# Patient Record
Sex: Female | Born: 1985 | Race: Black or African American | Marital: Single | Smoking: Current every day smoker
Health system: Southern US, Community
[De-identification: ages and names within clinical notes are randomized; demographics above are authoritative.]

## PROBLEM LIST (undated history)

## (undated) DIAGNOSIS — N649 Disorder of breast, unspecified: Secondary | ICD-10-CM

## (undated) HISTORY — DX: Disorder of breast, unspecified: N64.9

---
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
## 2005-10-17 ENCOUNTER — Emergency Department (HOSPITAL_COMMUNITY): Admission: EM | Admit: 2005-10-17 | Discharge: 2005-10-17 | Payer: Self-pay | Admitting: Emergency Medicine

## 2006-02-18 ENCOUNTER — Emergency Department (HOSPITAL_COMMUNITY): Admission: EM | Admit: 2006-02-18 | Discharge: 2006-02-18 | Payer: Self-pay | Admitting: Emergency Medicine

## 2006-07-01 ENCOUNTER — Emergency Department (HOSPITAL_COMMUNITY): Admission: EM | Admit: 2006-07-01 | Discharge: 2006-07-01 | Payer: Self-pay | Admitting: Emergency Medicine

## 2006-10-03 ENCOUNTER — Emergency Department (HOSPITAL_COMMUNITY): Admission: EM | Admit: 2006-10-03 | Discharge: 2006-10-03 | Payer: Self-pay | Admitting: Emergency Medicine

## 2007-01-21 ENCOUNTER — Emergency Department (HOSPITAL_COMMUNITY): Admission: EM | Admit: 2007-01-21 | Discharge: 2007-01-21 | Payer: Self-pay | Admitting: Emergency Medicine

## 2007-03-08 ENCOUNTER — Emergency Department (HOSPITAL_COMMUNITY): Admission: EM | Admit: 2007-03-08 | Discharge: 2007-03-08 | Payer: Self-pay | Admitting: Emergency Medicine

## 2007-10-14 ENCOUNTER — Emergency Department (HOSPITAL_COMMUNITY): Admission: EM | Admit: 2007-10-14 | Discharge: 2007-10-14 | Payer: Self-pay | Admitting: Emergency Medicine

## 2008-08-11 ENCOUNTER — Emergency Department (HOSPITAL_COMMUNITY): Admission: EM | Admit: 2008-08-11 | Discharge: 2008-08-11 | Payer: Self-pay | Admitting: Emergency Medicine

## 2009-02-27 ENCOUNTER — Emergency Department (HOSPITAL_COMMUNITY): Admission: EM | Admit: 2009-02-27 | Discharge: 2009-02-27 | Payer: Self-pay | Admitting: Emergency Medicine

## 2009-04-27 ENCOUNTER — Emergency Department (HOSPITAL_COMMUNITY): Admission: EM | Admit: 2009-04-27 | Discharge: 2009-04-27 | Payer: Self-pay | Admitting: Emergency Medicine

## 2010-01-11 ENCOUNTER — Emergency Department (HOSPITAL_COMMUNITY): Admission: EM | Admit: 2010-01-11 | Discharge: 2010-01-11 | Payer: Self-pay | Admitting: Emergency Medicine

## 2010-02-05 ENCOUNTER — Emergency Department (HOSPITAL_COMMUNITY): Admission: EM | Admit: 2010-02-05 | Discharge: 2010-02-05 | Payer: Self-pay | Admitting: Emergency Medicine

## 2010-02-14 ENCOUNTER — Emergency Department (HOSPITAL_COMMUNITY): Admission: EM | Admit: 2010-02-14 | Discharge: 2010-02-14 | Payer: Self-pay | Admitting: Emergency Medicine

## 2010-02-23 ENCOUNTER — Emergency Department (HOSPITAL_COMMUNITY): Admission: EM | Admit: 2010-02-23 | Discharge: 2010-02-23 | Payer: Self-pay | Admitting: Emergency Medicine

## 2010-03-07 ENCOUNTER — Other Ambulatory Visit: Admission: RE | Admit: 2010-03-07 | Discharge: 2010-03-07 | Payer: Self-pay | Admitting: Obstetrics and Gynecology

## 2010-03-27 ENCOUNTER — Emergency Department (HOSPITAL_COMMUNITY): Admission: EM | Admit: 2010-03-27 | Discharge: 2010-03-28 | Payer: Self-pay | Admitting: Emergency Medicine

## 2010-10-17 ENCOUNTER — Inpatient Hospital Stay (HOSPITAL_COMMUNITY)
Admission: AD | Admit: 2010-10-17 | Discharge: 2010-10-21 | Payer: Self-pay | Source: Home / Self Care | Attending: Obstetrics & Gynecology | Admitting: Obstetrics & Gynecology

## 2010-10-17 ENCOUNTER — Inpatient Hospital Stay (HOSPITAL_COMMUNITY)
Admission: AD | Admit: 2010-10-17 | Discharge: 2010-10-17 | Payer: Self-pay | Source: Home / Self Care | Attending: Obstetrics and Gynecology | Admitting: Obstetrics and Gynecology

## 2010-10-17 LAB — CBC
HCT: 37.5 % (ref 36.0–46.0)
Hemoglobin: 12.9 g/dL (ref 12.0–15.0)
MCH: 27.6 pg (ref 26.0–34.0)
MCHC: 34.4 g/dL (ref 30.0–36.0)
MCV: 80.1 fL (ref 78.0–100.0)
Platelets: 156 10*3/uL (ref 150–400)
RBC: 4.68 MIL/uL (ref 3.87–5.11)
RDW: 14.4 % (ref 11.5–15.5)
WBC: 10.1 10*3/uL (ref 4.0–10.5)

## 2010-10-17 LAB — RPR: RPR Ser Ql: NONREACTIVE

## 2010-10-18 ENCOUNTER — Encounter: Payer: Self-pay | Admitting: Obstetrics & Gynecology

## 2010-10-28 LAB — CBC
HCT: 25 % — ABNORMAL LOW (ref 36.0–46.0)
Hemoglobin: 8.4 g/dL — ABNORMAL LOW (ref 12.0–15.0)
MCH: 27.1 pg (ref 26.0–34.0)
MCHC: 33.6 g/dL (ref 30.0–36.0)
MCV: 80.6 fL (ref 78.0–100.0)
Platelets: 121 10*3/uL — ABNORMAL LOW (ref 150–400)
RBC: 3.1 MIL/uL — ABNORMAL LOW (ref 3.87–5.11)
RDW: 14.8 % (ref 11.5–15.5)
WBC: 11.6 10*3/uL — ABNORMAL HIGH (ref 4.0–10.5)

## 2010-12-29 LAB — URINALYSIS, ROUTINE W REFLEX MICROSCOPIC
Bilirubin Urine: NEGATIVE
Glucose, UA: NEGATIVE mg/dL
Ketones, ur: NEGATIVE mg/dL
Leukocytes, UA: NEGATIVE
Nitrite: NEGATIVE
Protein, ur: NEGATIVE mg/dL
Specific Gravity, Urine: 1.025 (ref 1.005–1.030)
Urobilinogen, UA: 0.2 mg/dL (ref 0.0–1.0)
pH: 6 (ref 5.0–8.0)

## 2010-12-29 LAB — URINE CULTURE
Colony Count: NO GROWTH
Culture: NO GROWTH

## 2010-12-29 LAB — WET PREP, GENITAL
Trich, Wet Prep: NONE SEEN
WBC, Wet Prep HPF POC: NONE SEEN
Yeast Wet Prep HPF POC: NONE SEEN

## 2010-12-29 LAB — URINE MICROSCOPIC-ADD ON

## 2010-12-29 LAB — GC/CHLAMYDIA PROBE AMP, GENITAL
Chlamydia, DNA Probe: NEGATIVE
GC Probe Amp, Genital: NEGATIVE

## 2010-12-31 LAB — BASIC METABOLIC PANEL
BUN: 5 mg/dL — ABNORMAL LOW (ref 6–23)
CO2: 25 mEq/L (ref 19–32)
Calcium: 9.2 mg/dL (ref 8.4–10.5)
Chloride: 105 mEq/L (ref 96–112)
Creatinine, Ser: 0.6 mg/dL (ref 0.4–1.2)
GFR calc Af Amer: >60 mL/min (ref >60)
GFR calc non Af Amer: >60 mL/min (ref >60)
Glucose, Bld: 85 mg/dL (ref 70–99)
Potassium: 3.7 mEq/L (ref 3.5–5.1)
Sodium: 134 mEq/L — ABNORMAL LOW (ref 135–145)

## 2010-12-31 LAB — URINALYSIS, ROUTINE W REFLEX MICROSCOPIC
Bilirubin Urine: NEGATIVE
Glucose, UA: NEGATIVE mg/dL
Hgb urine dipstick: NEGATIVE
Ketones, ur: NEGATIVE mg/dL
Nitrite: NEGATIVE
Protein, ur: NEGATIVE mg/dL
Specific Gravity, Urine: 1.01 (ref 1.005–1.030)
Urobilinogen, UA: 0.2 mg/dL (ref 0.0–1.0)
pH: 7 (ref 5.0–8.0)

## 2010-12-31 LAB — DIFFERENTIAL
Basophils Absolute: 0 10*3/uL (ref 0.0–0.1)
Basophils Relative: 1 % (ref 0–1)
Eosinophils Absolute: 0.2 10*3/uL (ref 0.0–0.7)
Eosinophils Relative: 7 % — ABNORMAL HIGH (ref 0–5)
Lymphocytes Relative: 36 % (ref 12–46)
Lymphs Abs: 1.3 10*3/uL (ref 0.7–4.0)
Monocytes Absolute: 0.3 10*3/uL (ref 0.1–1.0)
Monocytes Relative: 9 % (ref 3–12)
Neutro Abs: 1.7 10*3/uL (ref 1.7–7.7)
Neutrophils Relative %: 48 % (ref 43–77)

## 2010-12-31 LAB — CBC
HCT: 39 % (ref 36.0–46.0)
Hemoglobin: 13.4 g/dL (ref 12.0–15.0)
MCHC: 34.5 g/dL (ref 30.0–36.0)
MCV: 82.3 fL (ref 78.0–100.0)
Platelets: 182 10*3/uL (ref 150–400)
RBC: 4.74 MIL/uL (ref 3.87–5.11)
RDW: 13.7 % (ref 11.5–15.5)
WBC: 3.5 10*3/uL — ABNORMAL LOW (ref 4.0–10.5)

## 2010-12-31 LAB — URINE CULTURE: Colony Count: 85000

## 2010-12-31 LAB — HCG, QUANTITATIVE, PREGNANCY: hCG, Beta Chain, Quant, S: 11720 m[IU]/mL — ABNORMAL HIGH (ref <5)

## 2010-12-31 LAB — POCT PREGNANCY, URINE: Preg Test, Ur: POSITIVE

## 2011-01-01 ENCOUNTER — Emergency Department (HOSPITAL_COMMUNITY)
Admission: EM | Admit: 2011-01-01 | Discharge: 2011-01-01 | Discharge status: Against Medical Advice | Payer: Medicaid Other | Attending: Emergency Medicine | Admitting: Emergency Medicine

## 2011-01-01 DIAGNOSIS — R079 Chest pain, unspecified: Secondary | ICD-10-CM | POA: Insufficient documentation

## 2011-01-10 NOTE — Discharge Summary (Signed)
  Annette Armstrong, Annette Armstrong                   ACCOUNT NO.:  [id number]  MEDICAL RECORD NO.:  [id number]          PATIENT TYPE:  INP  LOCATION:  9310                          FACILITY:  [facility]  PHYSICIAN:  Allie Bossier, MD        DATE OF BIRTH:  12-09-1985  DATE OF ADMISSION:  10/17/2010 DATE OF DISCHARGE:  10/21/2010                              DISCHARGE SUMMARY   ADMISSION DIAGNOSIS:  Pregnancy at term, 41 weeks with labor.  DISCHARGE DIAGNOSES: 1. Pregnancy at term. 2. Primary low transverse C-section because of non-reassuring fetal     heart monitor.  PROCEDURES PERFORMED:  Primary lower transverse C-section.  FINDINGS:  Viable female infant, meconium-stained fluid, vertex, OP presentation.  Grossly normal uterus, ovaries, and fallopian tubes bilaterally.  Apgars 8 at 1 minute and 8 at 5 minutes.  ESTIMATED BLOOD LOSS:  1400 mL.  COMPLICATIONS:  No complications.  HOSPITAL COURSE:  This is a 25 year old lady who was admitted with termpregnancy and labor.  During the labor, the patient was doing okay. After that, the fetal monitor was showing late decelerations without pushing, variability decreased during pushing.  Fetal position at that time was okay,  positive caput and station 2.  Because of these changes in the fetal monitor and the position of the baby, was transferred to do immediately C-section.  This procedure went without complications.  The result of this procedure was a female baby with Apgars of 8 at  1 minute and 8 at 5 minute.  Baby was handed to the NICU team for evaluation because of the meconium.  Mother at the end of the procedure was stable and vital signs remained stable.  The hemoglobin at discharge was 8.4.  MEDICATIONS: 1. Percocet 325 mg q.4 h. as needed. 2. Motrin 600 mg q.6 h. as needed. 3. Docusate sodium 100 mg twice daily.  DIET:  Regular diet.  ACTIVITY:  Pelvic rest for 6 weeks.  FOLLOWUP:  With [facility] in 6 weeks.  Patient is planning to bottle  feed.  ANTI-CONCEPTION:  The patient is planning to have the implant with her primary physician, Dr. Emelda Fear at the [facility].  Dictated for Lloyd Huger, MD    ______________________________ Presley Raddle, MD   ______________________________ Allie Bossier, MD    AM/MEDQ  D:  10/21/2010  T:  10/21/2010  Job:  [id number]  Electronically Signed by Nicholaus Bloom MD on 01/10/2011 11:21:26 AM

## 2011-01-19 LAB — URINE MICROSCOPIC-ADD ON

## 2011-01-19 LAB — URINALYSIS, ROUTINE W REFLEX MICROSCOPIC
Bilirubin Urine: NEGATIVE
Glucose, UA: NEGATIVE mg/dL
Ketones, ur: NEGATIVE mg/dL
Nitrite: NEGATIVE
Protein, ur: NEGATIVE mg/dL
Specific Gravity, Urine: 1.005 (ref 1.005–1.030)
Urobilinogen, UA: 0.2 mg/dL (ref 0.0–1.0)
pH: 6 (ref 5.0–8.0)

## 2011-01-19 LAB — URINE CULTURE: Colony Count: >=100000

## 2011-01-21 LAB — WET PREP, GENITAL: Yeast Wet Prep HPF POC: NONE SEEN

## 2011-01-21 LAB — PREGNANCY, URINE: Preg Test, Ur: NEGATIVE

## 2011-01-21 LAB — URINALYSIS, ROUTINE W REFLEX MICROSCOPIC
Bilirubin Urine: NEGATIVE
Glucose, UA: NEGATIVE mg/dL
Hgb urine dipstick: NEGATIVE
Ketones, ur: NEGATIVE mg/dL
Nitrite: NEGATIVE
Protein, ur: NEGATIVE mg/dL
Specific Gravity, Urine: 1.02 (ref 1.005–1.030)
Urobilinogen, UA: 0.2 mg/dL (ref 0.0–1.0)
pH: 6.5 (ref 5.0–8.0)

## 2011-01-21 LAB — GC/CHLAMYDIA PROBE AMP, GENITAL
Chlamydia, DNA Probe: NEGATIVE
GC Probe Amp, Genital: NEGATIVE

## 2011-06-10 ENCOUNTER — Emergency Department (HOSPITAL_COMMUNITY)
Admission: EM | Admit: 2011-06-10 | Discharge: 2011-06-10 | Disposition: A | Discharge status: Home / Self Care | Payer: Medicaid Other | Attending: Emergency Medicine | Admitting: Emergency Medicine

## 2011-06-10 DIAGNOSIS — N390 Urinary tract infection, site not specified: Secondary | ICD-10-CM | POA: Insufficient documentation

## 2011-06-10 LAB — PREGNANCY, URINE: Preg Test, Ur: NEGATIVE

## 2011-06-10 LAB — URINALYSIS, ROUTINE W REFLEX MICROSCOPIC
Glucose, UA: NEGATIVE mg/dL
Nitrite: NEGATIVE
Specific Gravity, Urine: >1.03 — ABNORMAL HIGH (ref 1.005–1.030)
pH: 6 (ref 5.0–8.0)

## 2011-06-10 LAB — URINE MICROSCOPIC-ADD ON

## 2011-06-10 MED ORDER — NITROFURANTOIN MONOHYD MACRO 100 MG PO CAPS: ORAL_CAPSULE | ORAL | Status: DC

## 2011-06-10 MED ORDER — PHENAZOPYRIDINE HCL 200 MG PO TABS: 200.0000 mg | ORAL_TABLET | Freq: Three times a day (TID) | ORAL | Status: AC

## 2011-06-10 MED ORDER — PHENAZOPYRIDINE HCL 97.2 MG PO TABS: 97.0000 mg | ORAL_TABLET | Freq: Three times a day (TID) | ORAL | Status: AC | PRN

## 2011-06-10 NOTE — ED Provider Notes (Signed)
Medical screening examination/treatment/procedure(s) were performed by non-physician practitioner and as supervising physician I was immediately available for consultation/collaboration.   Laray Anger, DO 06/10/11 2218

## 2011-06-10 NOTE — ED Notes (Signed)
Complain of painful urination 

## 2011-06-10 NOTE — ED Provider Notes (Signed)
History     CSN: [id number] Arrival date & time: 06/10/2011  4:03 PM  Chief Complaint  Patient presents with  . Urinary Retention   HPI Comments: Discomfort at the end of urination..   + frequency and urgency.  ? Hematuria.  Sx's and menses began yest.  Denies fever or chills.  Mod low back pain.  Pt of dr. Felecia Shelling.  The history is provided by the patient. No language interpreter was used.    History reviewed. No pertinent past medical history.  History reviewed. No pertinent past surgical history.  History reviewed. No pertinent family history.  History  Substance Use Topics  . Smoking status: Current Everyday Smoker  . Smokeless tobacco: Not on file  . Alcohol Use: Yes    OB History    Grav Para Term Preterm Abortions TAB SAB Ect Mult Living                  Review of Systems  Constitutional: Negative for fever.  Genitourinary: Positive for dysuria, urgency and frequency. Negative for hematuria.       Onset of menses yest.    Physical Exam  BP 128/57  Pulse 64  Temp(Src) 98.3 F (36.8 C) (Oral)  Resp 20  Ht 5\' 3"  (1.6 m)  Wt 118 lb (53.524 kg)  BMI 20.90 kg/m2  SpO2 100%  LMP 06/09/2011  Physical Exam  Nursing note and vitals reviewed. Constitutional: She is oriented to person, place, and time. Vital signs are normal. She appears well-developed and well-nourished. No distress.  HENT:  Head: Normocephalic and atraumatic.  Right Ear: External ear normal.  Left Ear: External ear normal.  Nose: Nose normal.  Mouth/Throat: No oropharyngeal exudate.  Eyes: Conjunctivae and EOM are normal. Pupils are equal, round, and reactive to light. Right eye exhibits no discharge. Left eye exhibits no discharge. No scleral icterus.  Neck: Normal range of motion. Neck supple. No JVD present. No tracheal deviation present. No thyromegaly present.  Cardiovascular: Normal rate, regular rhythm, normal heart sounds, intact distal pulses and normal pulses.  Exam reveals no gallop  and no friction rub.   No murmur heard. Pulmonary/Chest: Effort normal and breath sounds normal. No stridor. No respiratory distress. She has no wheezes. She has no rales. She exhibits no tenderness.  Abdominal: Soft. Normal appearance and bowel sounds are normal. She exhibits no distension and no mass. There is tenderness. There is no rebound and no guarding.       Suprapubic PT.  No CVAT.  Musculoskeletal: Normal range of motion. She exhibits no edema and no tenderness.  Lymphadenopathy:    She has no cervical adenopathy.  Neurological: She is alert and oriented to person, place, and time. She has normal reflexes. No cranial nerve deficit. Coordination normal. GCS eye subscore is 4. GCS verbal subscore is 5. GCS motor subscore is 6.  Skin: Skin is warm and dry. No rash noted. She is not diaphoretic.  Psychiatric: She has a normal mood and affect. Her speech is normal and behavior is normal. Judgment and thought content normal. Cognition and memory are normal.    ED Course  Procedures  MDM Pt understands and agrees with treatment.      Worthy Rancher, PA 06/10/11 1750

## 2011-06-12 LAB — URINE CULTURE

## 2011-06-13 NOTE — ED Notes (Signed)
Results received from [facility].(+) URNC, >/= 100,000 colonies  -> E Coli.  RX given in ED for Nitrofurantoin  -> sensitive to the same.  Chart appended per protocol.  

## 2011-07-02 LAB — URINALYSIS, ROUTINE W REFLEX MICROSCOPIC
Nitrite: POSITIVE — AB
Specific Gravity, Urine: 1.025
Urobilinogen, UA: 1
pH: 7.5

## 2011-07-02 LAB — URINE MICROSCOPIC-ADD ON

## 2011-07-14 LAB — GC/CHLAMYDIA PROBE AMP, GENITAL
Chlamydia, DNA Probe: POSITIVE — AB
GC Probe Amp, Genital: NEGATIVE

## 2011-07-14 LAB — WET PREP, GENITAL: Trich, Wet Prep: NONE SEEN

## 2011-07-14 LAB — URINALYSIS, ROUTINE W REFLEX MICROSCOPIC
Bilirubin Urine: NEGATIVE
Glucose, UA: NEGATIVE
Hgb urine dipstick: NEGATIVE
Protein, ur: NEGATIVE
Specific Gravity, Urine: 1.01
Urobilinogen, UA: 0.2

## 2012-01-20 ENCOUNTER — Encounter (HOSPITAL_COMMUNITY): Payer: Self-pay

## 2012-01-20 ENCOUNTER — Emergency Department (HOSPITAL_COMMUNITY)
Admission: EM | Admit: 2012-01-20 | Discharge: 2012-01-20 | Disposition: A | Discharge status: Home / Self Care | Payer: Medicaid Other | Attending: Emergency Medicine | Admitting: Emergency Medicine

## 2012-01-20 DIAGNOSIS — R6883 Chills (without fever): Secondary | ICD-10-CM | POA: Insufficient documentation

## 2012-01-20 DIAGNOSIS — K5289 Other specified noninfective gastroenteritis and colitis: Secondary | ICD-10-CM | POA: Insufficient documentation

## 2012-01-20 DIAGNOSIS — K529 Noninfective gastroenteritis and colitis, unspecified: Secondary | ICD-10-CM

## 2012-01-20 DIAGNOSIS — R197 Diarrhea, unspecified: Secondary | ICD-10-CM | POA: Insufficient documentation

## 2012-01-20 DIAGNOSIS — IMO0001 Reserved for inherently not codable concepts without codable children: Secondary | ICD-10-CM | POA: Insufficient documentation

## 2012-01-20 DIAGNOSIS — R1110 Vomiting, unspecified: Secondary | ICD-10-CM | POA: Insufficient documentation

## 2012-01-20 LAB — DIFFERENTIAL
Basophils Absolute: 0 10*3/uL (ref 0.0–0.1)
Basophils Relative: 1 % (ref 0–1)
Eosinophils Absolute: 0.1 10*3/uL (ref 0.0–0.7)
Eosinophils Relative: 2 % (ref 0–5)
Neutrophils Relative %: 63 % (ref 43–77)

## 2012-01-20 LAB — URINALYSIS, ROUTINE W REFLEX MICROSCOPIC
Hgb urine dipstick: NEGATIVE
Nitrite: NEGATIVE
Protein, ur: 30 mg/dL — AB
Urobilinogen, UA: 0.2 mg/dL (ref 0.0–1.0)

## 2012-01-20 LAB — CBC
MCH: 27.3 pg (ref 26.0–34.0)
MCV: 80.7 fL (ref 78.0–100.0)
Platelets: 314 10*3/uL (ref 150–400)
RDW: 13.9 % (ref 11.5–15.5)

## 2012-01-20 LAB — BASIC METABOLIC PANEL
Calcium: 9.4 mg/dL (ref 8.4–10.5)
GFR calc Af Amer: >90 mL/min (ref >90)
GFR calc non Af Amer: >90 mL/min (ref >90)
Glucose, Bld: 91 mg/dL (ref 70–99)
Potassium: 3.5 mEq/L (ref 3.5–5.1)
Sodium: 133 mEq/L — ABNORMAL LOW (ref 135–145)

## 2012-01-20 LAB — POCT PREGNANCY, URINE: Preg Test, Ur: NEGATIVE

## 2012-01-20 MED ORDER — ONDANSETRON HCL 4 MG PO TABS: ORAL_TABLET | ORAL | Status: DC

## 2012-01-20 MED ORDER — SODIUM CHLORIDE 0.9 % IV BOLUS (SEPSIS)
1000.0000 mL | Freq: Once | INTRAVENOUS | Status: AC
  Administered 2012-01-20: 1000 mL via INTRAVENOUS

## 2012-01-20 MED ORDER — LOPERAMIDE HCL 2 MG PO CAPS: 4.0000 mg | ORAL_CAPSULE | Freq: Four times a day (QID) | ORAL | Status: AC | PRN

## 2012-01-20 MED ORDER — ONDANSETRON HCL 4 MG/2ML IJ SOLN
4.0000 mg | Freq: Once | INTRAMUSCULAR | Status: AC
  Administered 2012-01-20: 4 mg via INTRAVENOUS
  Filled 2012-01-20: qty 2

## 2012-01-20 NOTE — ED Notes (Signed)
Pt c/o n/v/d and generalized abd pain x 1 week.

## 2012-01-20 NOTE — ED Provider Notes (Signed)
History     CSN: [id number]  Arrival date & time 01/20/12  4098   First MD Initiated Contact with Patient 01/20/12 1114      Chief Complaint  Patient presents with  . Emesis  . Diarrhea    (Consider location/radiation/quality/duration/timing/severity/associated sxs/prior treatment) Patient is a 26 y.o. female presenting with vomiting and diarrhea. The history is provided by the patient.  Emesis  This is a new problem. The current episode started more than 2 days ago. The problem occurs 2 to 4 times per day. The problem has been gradually worsening. The emesis has an appearance of stomach contents. There has been no fever. Associated symptoms include chills, diarrhea and myalgias. Pertinent negatives include no abdominal pain, no arthralgias and no cough. Risk factors: young child in daycare.  Diarrhea The primary symptoms include vomiting, diarrhea and myalgias. Primary symptoms do not include abdominal pain, dysuria or arthralgias.  The illness is also significant for chills. The illness does not include back pain.    History reviewed. No pertinent past medical history.  History reviewed. No pertinent past surgical history.  No family history on file.  History  Substance Use Topics  . Smoking status: Current Everyday Smoker  . Smokeless tobacco: Not on file  . Alcohol Use: No    OB History    Grav Para Term Preterm Abortions TAB SAB Ect Mult Living                  Review of Systems  Constitutional: Positive for chills. Negative for activity change.       All ROS Neg except as noted in HPI  HENT: Negative for nosebleeds and neck pain.   Eyes: Negative for photophobia and discharge.  Respiratory: Negative for cough, shortness of breath and wheezing.   Cardiovascular: Negative for chest pain and palpitations.  Gastrointestinal: Positive for vomiting and diarrhea. Negative for abdominal pain and blood in stool.  Genitourinary: Negative for dysuria, frequency and  hematuria.  Musculoskeletal: Positive for myalgias. Negative for back pain and arthralgias.  Skin: Negative.   Neurological: Negative for dizziness, seizures and speech difficulty.  Psychiatric/Behavioral: Negative for hallucinations and confusion.    Allergies  Coconut flavor  Home Medications   Current Outpatient Rx  Name Route Sig Dispense Refill  . BISMUTH SUBSALICYLATE 262 MG PO CHEW Oral Chew 786 mg by mouth as needed. Upset Stomach    . NORELGESTROMIN-ETH ESTRADIOL 150-20 MCG/24HR TD PTWK Transdermal Place 1 patch onto the skin once a week.      BP 113/50  Pulse 81  Temp(Src) 98.3 F (36.8 C) (Oral)  Resp 20  Ht 5\' 3"  (1.6 m)  Wt 115 lb (52.164 kg)  BMI 20.37 kg/m2  SpO2 100%  LMP 01/13/2012  Physical Exam  Nursing note and vitals reviewed. Constitutional: She is oriented to person, place, and time. She appears well-developed and well-nourished.  Non-toxic appearance.  HENT:  Head: Normocephalic.  Right Ear: Tympanic membrane and external ear normal.  Left Ear: Tympanic membrane and external ear normal.  Eyes: EOM and lids are normal. Pupils are equal, round, and reactive to light.  Neck: Normal range of motion. Neck supple. Carotid bruit is not present.  Cardiovascular: Normal rate, regular rhythm, normal heart sounds, intact distal pulses and normal pulses.   Pulmonary/Chest: Breath sounds normal. No respiratory distress.  Abdominal: Soft. Bowel sounds are normal. She exhibits no mass. There is no tenderness. There is no rebound and no guarding.  Musculoskeletal: Normal range of  motion.  Lymphadenopathy:       Head (right side): No submandibular adenopathy present.       Head (left side): No submandibular adenopathy present.    She has no cervical adenopathy.  Neurological: She is alert and oriented to person, place, and time. She has normal strength. No cranial nerve deficit or sensory deficit.  Skin: Skin is warm and dry.  Psychiatric: She has a normal mood  and affect. Her speech is normal.    ED Course  Procedures (including critical care time)  Labs Reviewed  BASIC METABOLIC PANEL - Abnormal; Notable for the following:    Sodium 133 (*)    All other components within normal limits  URINALYSIS, ROUTINE W REFLEX MICROSCOPIC - Abnormal; Notable for the following:    Protein, ur 30 (*)    All other components within normal limits  URINE MICROSCOPIC-ADD ON - Abnormal; Notable for the following:    Squamous Epithelial / LPF FEW (*)    Bacteria, UA FEW (*)    All other components within normal limits  CBC  DIFFERENTIAL  POCT PREGNANCY, URINE   No results found.   No diagnosis found.    MDM  I have reviewed nursing notes, vital signs, and all appropriate lab and imaging results for this patient. Test results reviewed with pt. No acute abnormalities. Rx for zofran and Immodium given to patient.      Kathie Dike, [place] 01/20/12 1348

## 2012-01-20 NOTE — Discharge Instructions (Signed)
Clear Liquid Diet The clear liquid dietconsists of foods that are liquid or will become liquid at room temperature.You should be able to see through the liquid and beverages. Examples of foods allowed on a clear liquid diet include fruit juice, broth or bouillon, gelatin, or frozen ice pops. The purpose of this diet is to provide necessary fluid, electrolytes such as sodium and potassium, and energy to keep the body functioning during times when you are not able to consume a regular diet.A clear liquid diet should not be continued for long periods of time as it is not nutritionally adequate.  REASONS FOR USING A CLEAR LIQUID DIET  In sudden onset (acute) conditions for a patient before or after surgery.   As the first step in oral feeding.   For fluid and electrolyte replacement in diarrheal diseases.   As a diet before certain medical tests are performed.  ADEQUACY The clear liquid diet is adequate only in ascorbic acid, according to the Recommended Dietary Allowances of the National Research Council. CHOOSING FOODS Breads and Starches  Allowed:  None are allowed.   Avoid: All are avoided.  Vegetables  Allowed:  Strained tomato or vegetable juice.   Avoid: Any others.  Fruit  Allowed:  Strained fruit juices and fruit drinks. Include 1 serving of citrus or vitamin C-enriched fruit juice daily.   Avoid: Any others.  Meat and Meat Substitutes  Allowed:  None are allowed.   Avoid: All are avoided.  Milk  Allowed:  None are allowed.   Avoid: All are avoided.  Soups and Combination Foods  Allowed:  Clear bouillon, broth, or strained broth-based soups.   Avoid: Any others.  Desserts and Sweets  Allowed:  Sugar, honey. High protein gelatin. Flavored gelatin, ices, or frozen ice pops that do not contain milk.   Avoid: Any others.  Fats and Oils  Allowed:  None are allowed.   Avoid: All are avoided.  Beverages  Allowed: Cereal beverages, coffee (regular or  decaffeinated), tea, or soda at the discretion of your caregiver.   Avoid: Any others.  Condiments  Allowed:  Iodized salt.   Avoid: Any others, including pepper.  Supplements  Allowed:  Liquid nutrition beverages.   Avoid: Any others that contain lactose or fiber.  SAMPLE MEAL PLAN Breakfast  4 oz (120 mL) strained orange juice.    to 1 cup (125 to 250 mL) gelatin (plain or fortified).   1 cup (250 mL) beverage (coffee or tea).   Sugar, if desired.  Midmorning Snack   cup (125 mL) gelatin (plain or fortified).  Lunch  1 cup (250 mL) broth or consomm.   4 oz (120 mL) strained grapefruit juice.    cup (125 mL) gelatin (plain or fortified).   1 cup (250 mL) beverage (coffee or tea).   Sugar, if desired.  Midafternoon Snack   cup (125 mL) fruit ice.    cup (125 mL) strained fruit juice.  Dinner  1 cup (250 mL) broth or consomm.    cup (125 mL) cranberry juice.    cup (125 mL) flavored gelatin (plain or fortified).   1 cup (250 mL) beverage (coffee or tea).   Sugar, if desired.  Evening Snack  4 oz (120 mL) strained apple juice (vitamin C-fortified).    cup (125 mL) flavored gelatin (plain or fortified).  Document Released: 09/29/2005 Document Revised: 09/18/2011 Document Reviewed: 12/27/2010 Exit[employer] Patient Information 2012 [employer].Viral Gastroenteritis Viral gastroenteritis is also known as stomach flu. This condition   affects the stomach and intestinal tract. It can cause sudden diarrhea and vomiting. The illness typically lasts 3 to 8 days. Most people develop an immune response that eventually gets rid of the virus. While this natural response develops, the virus can make you quite ill. CAUSES  Many different viruses can cause gastroenteritis, such as rotavirus or noroviruses. You can catch one of these viruses by consuming contaminated food or water. You may also catch a virus by sharing utensils or other personal items with an  infected person or by touching a contaminated surface. SYMPTOMS  The most common symptoms are diarrhea and vomiting. These problems can cause a severe loss of body fluids (dehydration) and a body salt (electrolyte) imbalance. Other symptoms may include:  Fever.   Headache.   Fatigue.   Abdominal pain.  DIAGNOSIS  Your caregiver can usually diagnose viral gastroenteritis based on your symptoms and a physical exam. A stool sample may also be taken to test for the presence of viruses or other infections. TREATMENT  This illness typically goes away on its own. Treatments are aimed at rehydration. The most serious cases of viral gastroenteritis involve vomiting so severely that you are not able to keep fluids down. In these cases, fluids must be given through an intravenous line (IV). HOME CARE INSTRUCTIONS   Drink enough fluids to keep your urine clear or pale yellow. Drink small amounts of fluids frequently and increase the amounts as tolerated.   Ask your caregiver for specific rehydration instructions.   Avoid:   Foods high in sugar.   Alcohol.   Carbonated drinks.   Tobacco.   Juice.   Caffeine drinks.   Extremely hot or cold fluids.   Fatty, greasy foods.   Too much intake of anything at one time.   Dairy products until 24 to 48 hours after diarrhea stops.   You may consume probiotics. Probiotics are active cultures of beneficial bacteria. They may lessen the amount and number of diarrheal stools in adults. Probiotics can be found in yogurt with active cultures and in supplements.   Wash your hands well to avoid spreading the virus.   Only take over-the-counter or prescription medicines for pain, discomfort, or fever as directed by your caregiver. Do not give aspirin to children. Antidiarrheal medicines are not recommended.   Ask your caregiver if you should continue to take your regular prescribed and over-the-counter medicines.   Keep all follow-up appointments  as directed by your caregiver.  SEEK IMMEDIATE MEDICAL CARE IF:   You are unable to keep fluids down.   You do not urinate at least once every 6 to 8 hours.   You develop shortness of breath.   You notice blood in your stool or vomit. This may look like coffee grounds.   You have abdominal pain that increases or is concentrated in one small area (localized).   You have persistent vomiting or diarrhea.   You have a fever.   The patient is a child younger than 3 months, and he or she has a fever.   The patient is a child older than 3 months, and he or she has a fever and persistent symptoms.   The patient is a child older than 3 months, and he or she has a fever and symptoms suddenly get worse.   The patient is a baby, and he or she has no tears when crying.  MAKE SURE YOU:   Understand these instructions.   Will watch your condition.     Will get help right away if you are not doing well or get worse.  Document Released: 09/29/2005 Document Revised: 09/18/2011 Document Reviewed: 07/16/2011 Exit[employer] Patient Information 2012 [employer]. 

## 2012-01-21 NOTE — ED Provider Notes (Signed)
Medical screening examination/treatment/procedure(s) were performed by non-physician practitioner and as supervising physician I was immediately available for consultation/collaboration.   Tabb Croghan III, MD 01/21/12 0038 

## 2012-02-04 IMAGING — US US OB TRANSVAGINAL
1 series · 13 of 28 positions shown · non-contrast
Comparison: None.

CLINICAL DATA: LMP 01/17/2010.  Pregnancy.  Quantitative beta HCG
[DATE].  Right mid pelvic pain.

OBSTETRIC <14 WK US AND TRANSVAGINAL OB US
TECHNIQUE: Both transabdominal and transvaginal ultrasound
examinations were performed for complete evaluation of the
gestation as well as the maternal uterus, adnexal regions, and
pelvic cul-de-sac.

[Series 1: us ob transvaginal · 0.23mm/px · 13 of 96 slices shown]
[im 4/96]
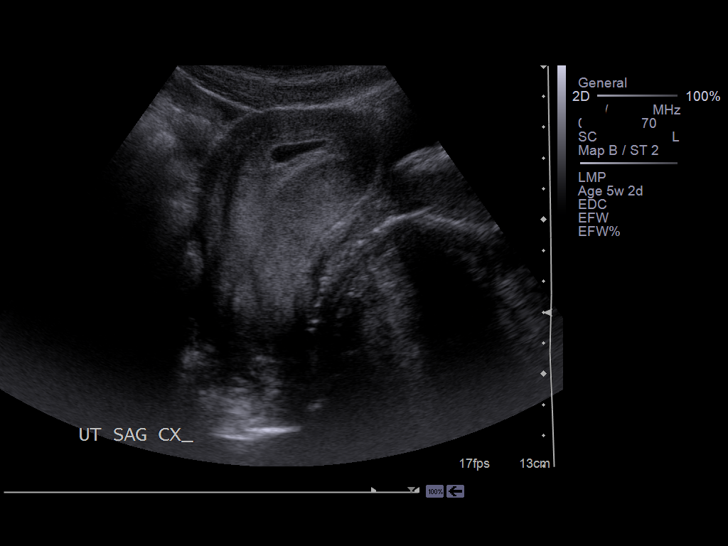
[im 11/96]
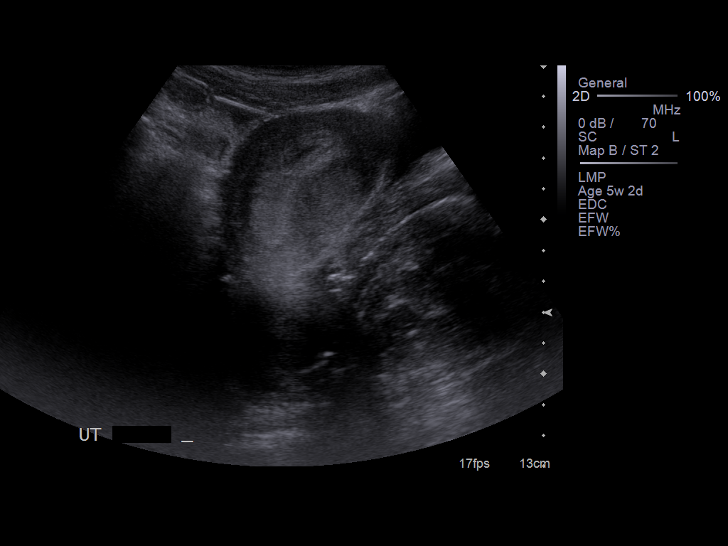
[im 18/96]
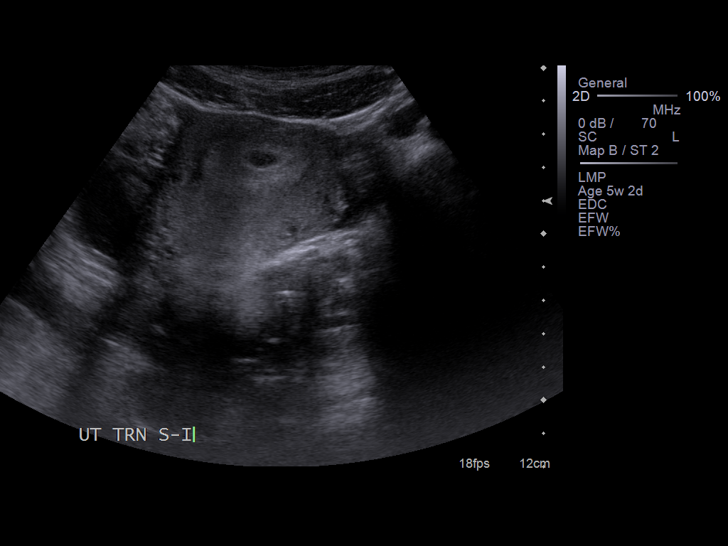
[im 25/96]
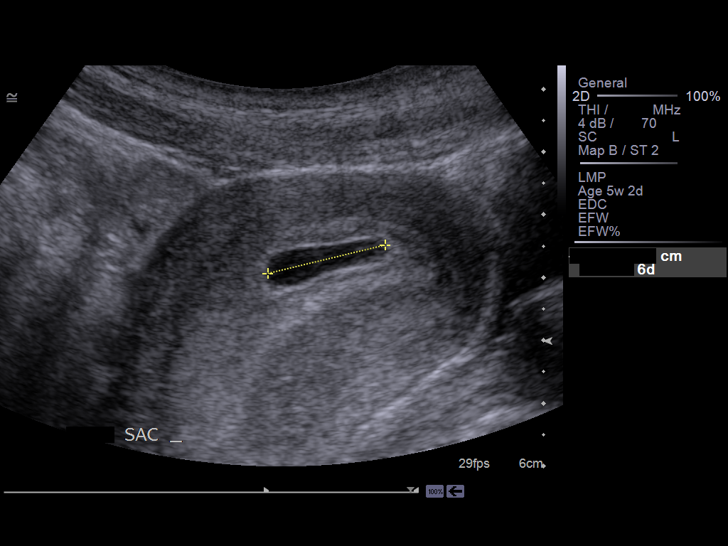
[im 32/96]
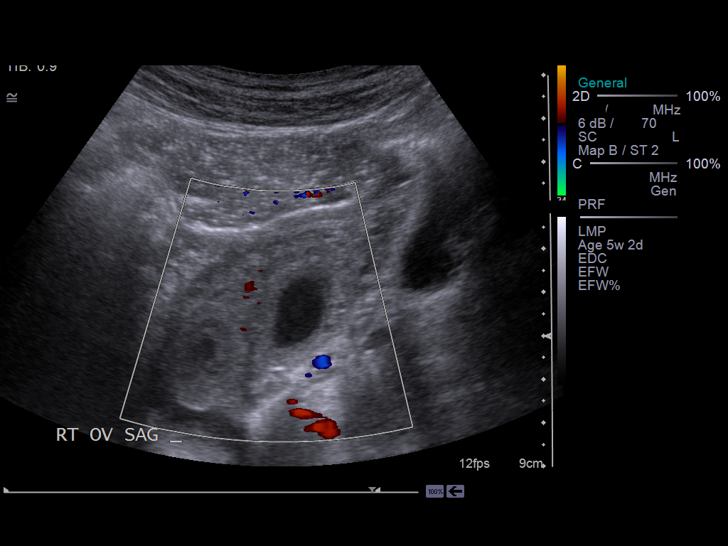
[im 39/96]
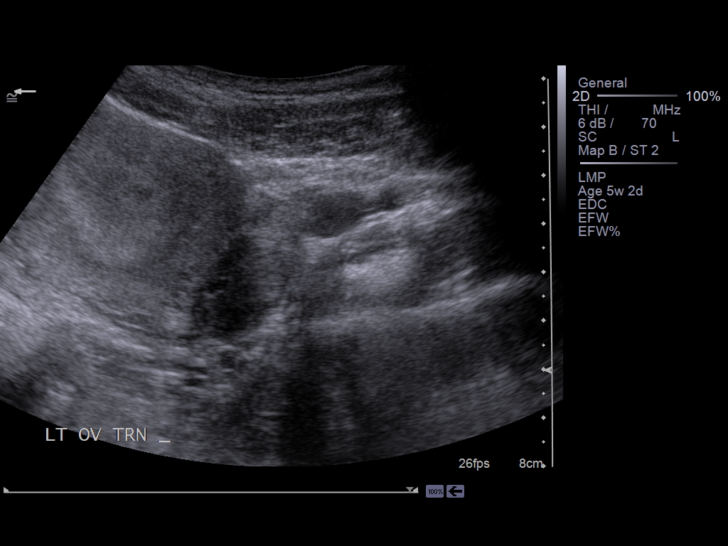
[im 50/96]
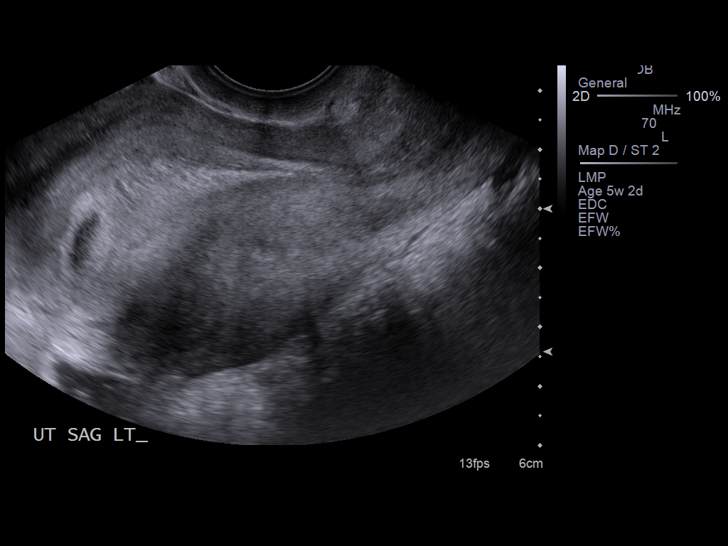
[im 57/96]
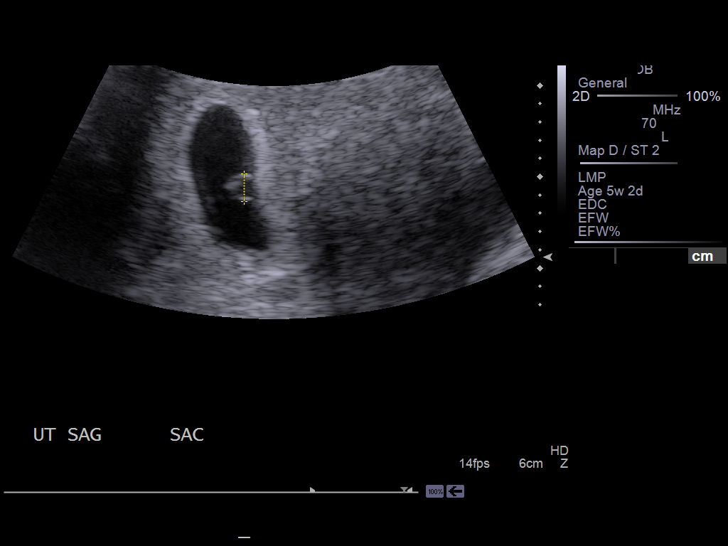
[im 64/96]
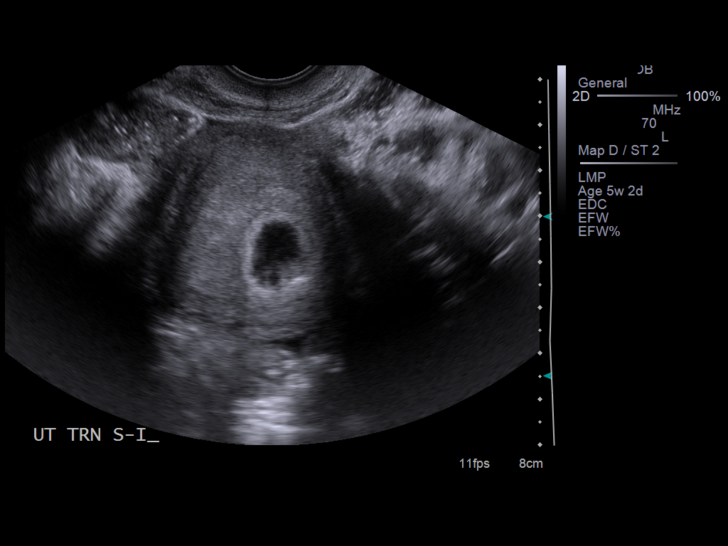
[im 71/96]
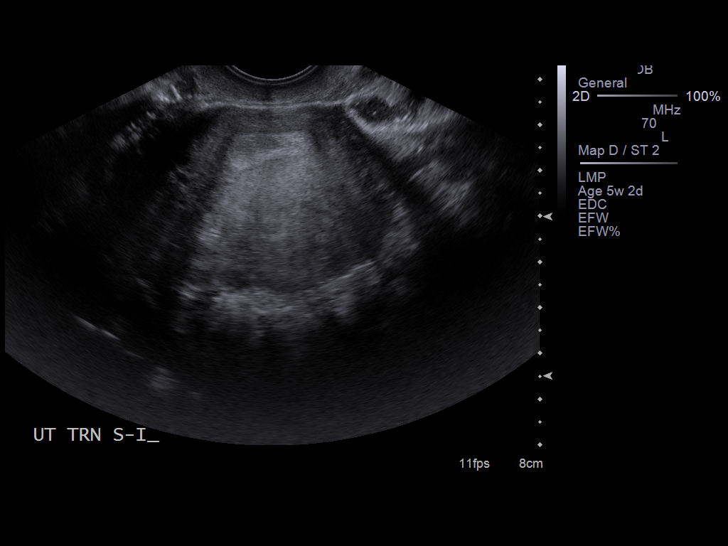
[im 78/96]
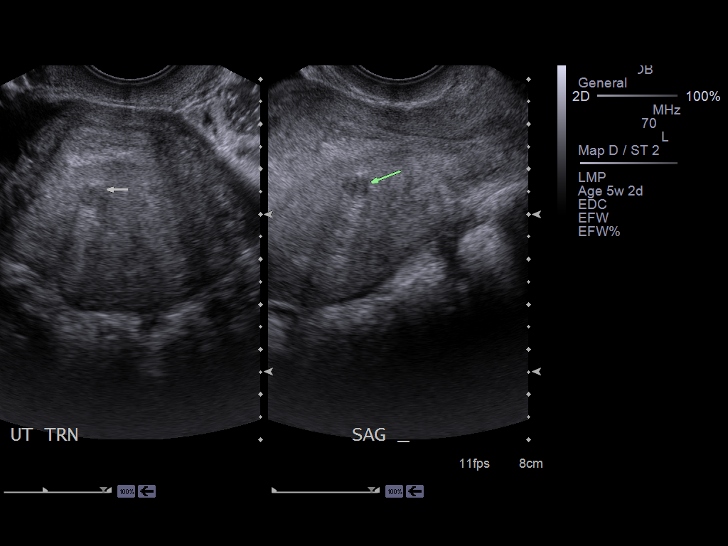
[im 85/96]
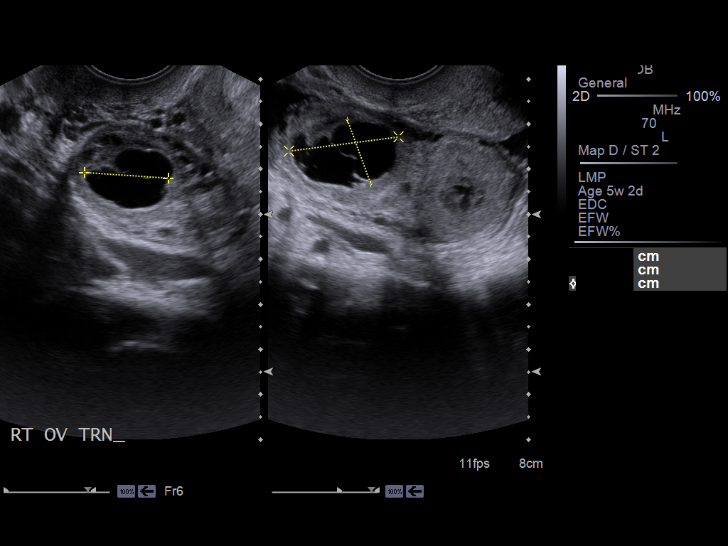
[im 92/96]
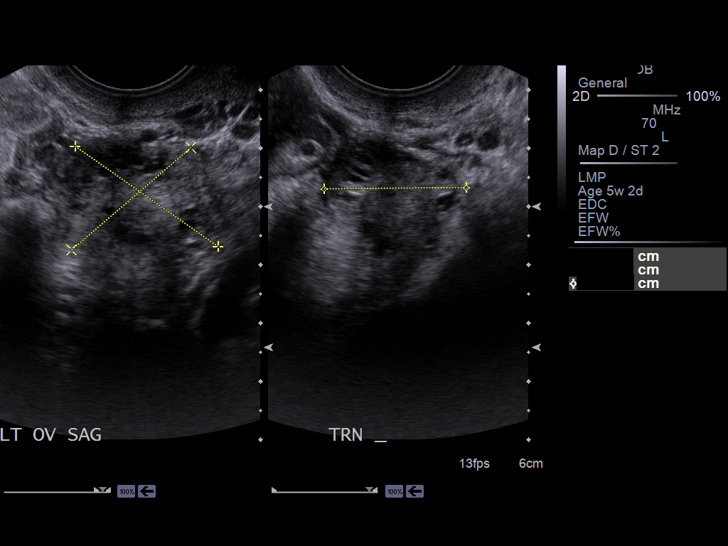

[13 of 28 positions shown; findings below may reference images not displayed]

Intrauterine gestational sac: Single
Yolk sac: Visualized
Embryo: Not discretely visible

MSD: 1.82  mm 6 weeks 5 days

Maternal uterus/adnexae:
7 x 6 x 6 mm round hypoechoic area in the posterior uterine body
near the midline is likely a tiny intramural fibroid.

Probable corpus luteum noted in the right ovary.  There is also a
2.5 cm follicle vs cyst within the right ovary which contains a few
thin internal septations.  Color Doppler flow is identified to the
right ovary.  No flow is seen within the dominant follicle.  Left
ovary is normal appearances.  Small amount of simple free pelvic
fluid.
IMPRESSION: 1.  Intrauterine gestational sac and yolk sac are visible.  A
discrete embryo is not definitely seen at this time.  If desired,
follow-up ultrasound in approximately 1 week could be performed to
confirm a living intrauterine pregnancy.  Otherwise, fetal anatomic
evaluation at 18 to 19 weeks is recommended.
2.  Probable sub-centimeter uterine fibroid.

## 2012-04-05 ENCOUNTER — Encounter (HOSPITAL_COMMUNITY): Payer: Self-pay | Admitting: *Deleted

## 2012-04-05 ENCOUNTER — Emergency Department (HOSPITAL_COMMUNITY): Payer: Medicaid Other

## 2012-04-05 ENCOUNTER — Emergency Department (HOSPITAL_COMMUNITY)
Admission: EM | Admit: 2012-04-05 | Discharge: 2012-04-05 | Disposition: A | Discharge status: Home / Self Care | Payer: Medicaid Other | Attending: Emergency Medicine | Admitting: Emergency Medicine

## 2012-04-05 DIAGNOSIS — F17200 Nicotine dependence, unspecified, uncomplicated: Secondary | ICD-10-CM | POA: Insufficient documentation

## 2012-04-05 DIAGNOSIS — M25539 Pain in unspecified wrist: Secondary | ICD-10-CM | POA: Insufficient documentation

## 2012-04-05 DIAGNOSIS — M25531 Pain in right wrist: Secondary | ICD-10-CM

## 2012-04-05 NOTE — ED Notes (Signed)
Rt wrist pain, onset yesterday,No known recent trauma.

## 2012-04-05 NOTE — ED Provider Notes (Signed)
History     CSN: [id number]  Arrival date & time 04/05/12  1111   None     Chief Complaint  Patient presents with  . Wrist Pain    (Consider location/radiation/quality/duration/timing/severity/associated sxs/prior treatment) HPI Comments: Does not recall any specific injury but "i may have hit it on something".  Patient is a 26 y.o. female presenting with wrist pain. The history is provided by the patient. No language interpreter was used.  Wrist Pain This is a new problem. The problem occurs constantly. Pertinent negatives include no chills or fever. The symptoms are aggravated by twisting (palpation). She has tried nothing for the symptoms.    History reviewed. No pertinent past medical history.  History reviewed. No pertinent past surgical history.  History reviewed. No pertinent family history.  History  Substance Use Topics  . Smoking status: Current Everyday Smoker  . Smokeless tobacco: Not on file  . Alcohol Use: No    OB History    Grav Para Term Preterm Abortions TAB SAB Ect Mult Living                  Review of Systems  Constitutional: Negative for fever and chills.  Musculoskeletal:       Wrist pain  All other systems reviewed and are negative.    Allergies  Coconut flavor  Home Medications   Current Outpatient Rx  Name Route Sig Dispense Refill  . NORELGESTROMIN-ETH ESTRADIOL 150-20 MCG/24HR TD PTWK Transdermal Place 1 patch onto the skin once a week.      BP 118/50  Pulse 76  Temp 99.7 F (37.6 C) (Oral)  Resp 20  Ht 5\' 3"  (1.6 m)  Wt 118 lb (53.524 kg)  BMI 20.90 kg/m2  SpO2 100%  LMP 03/31/2012  Physical Exam  Nursing note reviewed. Constitutional: She is oriented to person, place, and time. She appears well-developed and well-nourished. No distress.  HENT:  Head: Normocephalic and atraumatic.  Eyes: EOM are normal.  Neck: Normal range of motion.  Cardiovascular: Normal rate, regular rhythm and normal heart sounds.     Pulmonary/Chest: Effort normal and breath sounds normal.  Abdominal: Soft. She exhibits no distension. There is no tenderness.  Musculoskeletal:       Right wrist: She exhibits decreased range of motion, tenderness and bony tenderness. She exhibits no swelling, no crepitus, no deformity and no laceration.       No visible swelling.  PT along ulnar aspect of hand and wrist.  Neurological: She is alert and oriented to person, place, and time.  Skin: Skin is warm and dry.  Psychiatric: She has a normal mood and affect. Judgment normal.    ED Course  Procedures (including critical care time)  Labs Reviewed - No data to display Dg Wrist Complete Right  04/05/2012  *RADIOLOGY REPORT*  Clinical Data: Wrist pain, pain with supination, questionable history of injury  RIGHT WRIST - COMPLETE 3+ VIEW  Comparison: None  Findings: Lunatotriquetral fusion, congenital. Osseous mineralization normal. Joint spaces otherwise preserved. No acute fracture, dislocation, or bone destruction.  IMPRESSION: No acute osseous abnormalities.  Original Report Authenticated By: Lollie Marrow, M.D.     1. Right wrist pain       MDM  Wrist splint, ice, ibuprofen        Evalina Field, PA 04/05/12 1344  Evalina Field, PA 04/05/12 1344

## 2012-04-05 NOTE — Discharge Instructions (Signed)
Cryotherapy Cryotherapy means treatment with cold. Ice or gel packs can be used to reduce both pain and swelling. Ice is the most helpful within the first 24 to 48 hours after an injury or flareup from overusing a muscle or joint. Sprains, strains, spasms, burning pain, shooting pain, and aches can all be eased with ice. Ice can also be used when recovering from surgery. Ice is effective, has very few side effects, and is safe for most people to use. PRECAUTIONS  Ice is not a safe treatment option for people with:  Raynaud's phenomenon. This is a condition affecting small blood vessels in the extremities. Exposure to cold may cause your problems to return.   Cold hypersensitivity. There are many forms of cold hypersensitivity, including:   Cold urticaria. Red, itchy hives appear on the skin when the tissues begin to warm after being iced.   Cold erythema. This is a red, itchy rash caused by exposure to cold.   Cold hemoglobinuria. Red blood cells break down when the tissues begin to warm after being iced. The hemoglobin that carry oxygen are passed into the urine because they cannot combine with blood proteins fast enough.   Numbness or altered sensitivity in the area being iced.  If you have any of the following conditions, do not use ice until you have discussed cryotherapy with your caregiver:  Heart conditions, such as arrhythmia, angina, or chronic heart disease.   High blood pressure.   Healing wounds or open skin in the area being iced.   Current infections.   Rheumatoid arthritis.   Poor circulation.   Diabetes.  Ice slows the blood flow in the region it is applied. This is beneficial when trying to stop inflamed tissues from spreading irritating chemicals to surrounding tissues. However, if you expose your skin to cold temperatures for too long or without the proper protection, you can damage your skin or nerves. Watch for signs of skin damage due to cold. HOME CARE  INSTRUCTIONS Follow these tips to use ice and cold packs safely.  Place a dry or damp towel between the ice and skin. A damp towel will cool the skin more quickly, so you may need to shorten the time that the ice is used.   For a more rapid response, add gentle compression to the ice.   Ice for no more than 10 to 20 minutes at a time. The bonier the area you are icing, the less time it will take to get the benefits of ice.   Check your skin after 5 minutes to make sure there are no signs of a poor response to cold or skin damage.   Rest 20 minutes or more in between uses.   Once your skin is numb, you can end your treatment. You can test numbness by very lightly touching your skin. The touch should be so light that you do not see the skin dimple from the pressure of your fingertip. When using ice, most people will feel these normal sensations in this order: cold, burning, aching, and numbness.   Do not use ice on someone who cannot communicate their responses to pain, such as small children or people with dementia.  HOW TO MAKE AN ICE PACK Ice packs are the most common way to use ice therapy. Other methods include ice massage, ice baths, and cryo-sprays. Muscle creams that cause a cold, tingly feeling do not offer the same benefits that ice offers and should not be used as a substitute  unless recommended by your caregiver. To make an ice pack, do one of the following:  Place crushed ice or a bag of frozen vegetables in a sealable plastic bag. Squeeze out the excess air. Place this bag inside another plastic bag. Slide the bag into a pillowcase or place a damp towel between your skin and the bag.   Mix 3 parts water with 1 part rubbing alcohol. Freeze the mixture in a sealable plastic bag. When you remove the mixture from the freezer, it will be slushy. Squeeze out the excess air. Place this bag inside another plastic bag. Slide the bag into a pillowcase or place a damp towel between your skin  and the bag.  SEEK MEDICAL CARE IF:  You develop white spots on your skin. This may give the skin a blotchy (mottled) appearance.   Your skin turns blue or pale.   Your skin becomes waxy or hard.   Your swelling gets worse.  MAKE SURE YOU:   Understand these instructions.   Will watch your condition.   Will get help right away if you are not doing well or get worse.  Document Released: 05/26/2011 Document Revised: 09/18/2011 Document Reviewed: 05/26/2011 [facility] Patient Information 2012 [place], [place].   The x-rays are normal.  Wear the splint for comfort.  Apply the ice several times daily. Take ibuprofen up to 600 mg every 8 hrs with food.   Follow up with you MD as needed.

## 2012-04-06 NOTE — ED Provider Notes (Signed)
Medical screening examination/treatment/procedure(s) were performed by non-physician practitioner and as supervising physician I was immediately available for consultation/collaboration.  Shelda Jakes, MD 04/06/12 1014

## 2012-05-24 ENCOUNTER — Encounter (HOSPITAL_COMMUNITY): Payer: Self-pay | Admitting: *Deleted

## 2012-05-24 ENCOUNTER — Emergency Department (HOSPITAL_COMMUNITY)
Admission: EM | Admit: 2012-05-24 | Discharge: 2012-05-25 | Disposition: A | Discharge status: Home / Self Care | Payer: Medicaid Other | Attending: Emergency Medicine | Admitting: Emergency Medicine

## 2012-05-24 DIAGNOSIS — S0990XA Unspecified injury of head, initial encounter: Secondary | ICD-10-CM
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
## 2012-05-25 ENCOUNTER — Emergency Department (HOSPITAL_COMMUNITY): Payer: Medicaid Other

## 2012-05-25 MED ORDER — IBUPROFEN 800 MG PO TABS
800.0000 mg | ORAL_TABLET | Freq: Once | ORAL | Status: AC
  Administered 2012-05-25: 800 mg via ORAL
  Filled 2012-05-25: qty 1

## 2012-05-25 MED ORDER — IBUPROFEN 600 MG PO TABS: 600.0000 mg | ORAL_TABLET | Freq: Three times a day (TID) | ORAL | Status: AC

## 2012-05-25 NOTE — ED Notes (Signed)
Discharge instructions given and reviewed with patient.  Prescription given for Motrin; effects and use explained.  Patient verbalized understanding to take Motrin as prescribed and with food and to follow up as needed.  Patient ambulatory; discharged home in good condition.

## 2012-05-25 NOTE — ED Provider Notes (Signed)
Medical screening examination/treatment/procedure(s) were performed by non-physician practitioner and as supervising physician I was immediately available for consultation/collaboration.  Shelda Jakes, MD 05/25/12 [phone]

## 2012-10-23 ENCOUNTER — Encounter (HOSPITAL_COMMUNITY): Payer: Self-pay

## 2012-10-23 ENCOUNTER — Emergency Department (HOSPITAL_COMMUNITY)
Admission: EM | Admit: 2012-10-23 | Discharge: 2012-10-24 | Disposition: A | Discharge status: Home / Self Care | Payer: Medicaid Other | Attending: Emergency Medicine | Admitting: Emergency Medicine

## 2012-10-23 DIAGNOSIS — L0291 Cutaneous abscess, unspecified: Secondary | ICD-10-CM

## 2012-10-23 DIAGNOSIS — K612 Anorectal abscess: Secondary | ICD-10-CM | POA: Insufficient documentation

## 2012-10-23 DIAGNOSIS — F17200 Nicotine dependence, unspecified, uncomplicated: Secondary | ICD-10-CM | POA: Insufficient documentation

## 2012-10-23 NOTE — ED Provider Notes (Signed)
History     CSN: [id number]  Arrival date & time 10/23/12  2320   First MD Initiated Contact with Patient 10/23/12 2340      Chief Complaint  Patient presents with  . Recurrent Skin Infections    (Consider location/radiation/quality/duration/timing/severity/associated sxs/prior treatment) HPI  Patient complaining of tender swollen area between vagina and rectum for two days.  She has been menstruating and unable to assess if discharge.  She has not noted fever or chills, no difficulty urinating.  Normal menses at normal time, no prior vaginal discharge.    History reviewed. No pertinent past medical history.  History reviewed. No pertinent past surgical history.  No family history on file.  History  Substance Use Topics  . Smoking status: Current Every Day Smoker  . Smokeless tobacco: Not on file  . Alcohol Use: Yes     Comment: occ    OB History    Grav Para Term Preterm Abortions TAB SAB Ect Mult Living                  Review of Systems  All other systems reviewed and are negative.    Allergies  Coconut flavor  Home Medications   Current Outpatient Rx  Name  Route  Sig  Dispense  Refill  . NORELGESTROMIN-ETH ESTRADIOL 150-20 MCG/24HR TD PTWK   Transdermal   Place 1 patch onto the skin once a week.           BP 127/59  Pulse 82  Temp 98.4 F (36.9 C) (Oral)  Resp 16  Ht 5\' 3"  (1.6 m)  Wt 126 lb (57.153 kg)  BMI 22.32 kg/m2  SpO2 99%  LMP 10/18/2012  Physical Exam  Nursing note and vitals reviewed. Constitutional: She is oriented to person, place, and time. She appears well-developed and well-nourished.  HENT:  Head: Normocephalic and atraumatic.  Eyes: Conjunctivae normal are normal. Pupils are equal, round, and reactive to light.  Cardiovascular: Normal rate and regular rhythm.   Pulmonary/Chest: Effort normal and breath sounds normal.  Abdominal: Soft. Bowel sounds are normal.  Genitourinary:       2 x2 cm abscess vs cyst perinemu  with pointing head but no discharge.   Neurological: She is alert and oriented to person, place, and time.  Skin: Skin is warm and dry.  Psychiatric: She has a normal mood and affect. Her behavior is normal. Judgment and thought content normal.    ED Course  Procedures (including critical care time)  Labs Reviewed - No data to display No results found.   No diagnosis found.    MDM  Pauline Aus, PA C to perform i and d.        Hilario Quarry, MD 10/23/12 2350

## 2012-10-23 NOTE — ED Notes (Signed)
Pt has a boil to perineum area x 2 days, no drainage yet

## 2012-10-24 MED ORDER — SULFAMETHOXAZOLE-TRIMETHOPRIM 800-160 MG PO TABS: 1.0000 | ORAL_TABLET | Freq: Two times a day (BID) | ORAL | Status: DC

## 2012-10-24 MED ORDER — HYDROCODONE-ACETAMINOPHEN 5-325 MG PO TABS: ORAL_TABLET | ORAL | Status: DC

## 2012-10-24 MED ORDER — LIDOCAINE HCL (PF) 1 % IJ SOLN
5.0000 mL | Freq: Once | INTRAMUSCULAR | Status: AC
  Administered 2012-10-24: 5 mL via INTRADERMAL
  Filled 2012-10-24 (×2): qty 5

## 2012-10-24 NOTE — ED Provider Notes (Signed)
   I was asked by the EDP to perform I&D of an abscess/cyst to the perineum.    INCISION AND DRAINAGE Performed by: Maxwell Caul. Consent: Verbal consent obtained. Risks and benefits: risks, benefits and alternatives were discussed Type: abscess  Body area:perineum Anesthesia: local infiltration  Incision was made with a #11 blade scalpel.  Local anesthetic: lidocaine 1% w/o epinephrine  Anesthetic total: 2 ml  Complexity: complex Blunt dissection to break up loculations  Drainage: purulent  Drainage amount: scant  Area was irrigated with nml saline   Patient tolerance: Patient tolerated the procedure well with no immediate complications.   pt agrees to warm water soaks, bactrim , vicodin pre-pack dispensed.  F/u with her GYN if needed.  Annette Armstrong, [place] 10/24/12 0104

## 2012-10-24 NOTE — ED Provider Notes (Signed)
See my full note. History/physical exam/procedure(s) were performed by non-physician practitioner and as supervising physician I was immediately available for consultation/collaboration. I have reviewed all notes and am in agreement with care and plan.   Hilario Quarry, MD 10/24/12 [phone]

## 2012-10-24 NOTE — ED Notes (Signed)
Pt given maxi-pad and disposable underwear

## 2012-10-27 MED FILL — Hydrocodone-Acetaminophen Tab 5-325 MG: ORAL | Qty: 6 | Status: AC

## 2013-01-10 ENCOUNTER — Other Ambulatory Visit: Payer: Self-pay | Admitting: Obstetrics & Gynecology

## 2013-01-10 MED ORDER — NORELGESTROMIN-ETH ESTRADIOL 150-35 MCG/24HR TD PTWK: 1.0000 | MEDICATED_PATCH | TRANSDERMAL | Status: DC

## 2013-02-02 ENCOUNTER — Emergency Department (HOSPITAL_COMMUNITY)
Admission: EM | Admit: 2013-02-02 | Discharge: 2013-02-02 | Disposition: A | Discharge status: Home / Self Care | Payer: Medicaid Other | Attending: Emergency Medicine | Admitting: Emergency Medicine

## 2013-02-02 ENCOUNTER — Encounter (HOSPITAL_COMMUNITY): Payer: Self-pay | Admitting: Emergency Medicine

## 2013-02-02 DIAGNOSIS — R197 Diarrhea, unspecified: Secondary | ICD-10-CM

## 2013-02-02 DIAGNOSIS — R5381 Other malaise: Secondary | ICD-10-CM | POA: Insufficient documentation

## 2013-02-02 DIAGNOSIS — F17200 Nicotine dependence, unspecified, uncomplicated: Secondary | ICD-10-CM | POA: Insufficient documentation

## 2013-02-02 DIAGNOSIS — R1032 Left lower quadrant pain: Secondary | ICD-10-CM | POA: Insufficient documentation

## 2013-02-02 MED ORDER — ONDANSETRON HCL 4 MG/2ML IJ SOLN
4.0000 mg | Freq: Once | INTRAMUSCULAR | Status: AC
  Administered 2013-02-02: 4 mg via INTRAVENOUS
  Filled 2013-02-02: qty 2

## 2013-02-02 MED ORDER — SODIUM CHLORIDE 0.9 % IV BOLUS (SEPSIS)
1000.0000 mL | Freq: Once | INTRAVENOUS | Status: AC
  Administered 2013-02-02: 1000 mL via INTRAVENOUS

## 2013-02-02 MED ORDER — DIPHENOXYLATE-ATROPINE 2.5-0.025 MG PO TABS: 1.0000 | ORAL_TABLET | Freq: Four times a day (QID) | ORAL | Status: DC | PRN

## 2013-02-02 MED ORDER — PANTOPRAZOLE SODIUM 40 MG IV SOLR
40.0000 mg | Freq: Once | INTRAVENOUS | Status: AC
  Administered 2013-02-02: 40 mg via INTRAVENOUS
  Filled 2013-02-02: qty 40

## 2013-02-02 MED ORDER — PROMETHAZINE HCL 25 MG PO TABS: 25.0000 mg | ORAL_TABLET | Freq: Four times a day (QID) | ORAL | Status: DC | PRN

## 2013-02-02 NOTE — ED Notes (Signed)
Pt having diarrhea every time she eats. Son and brother have been sick with same.

## 2013-02-02 NOTE — ED Provider Notes (Signed)
History  This chart was scribed for Donnetta Hutching, MD by Bennett Scrape, ED Scribe. This patient was seen in room APA07/APA07 and the patient's care was started at 7:18 AM.  CSN: [id number]  Arrival date & time 02/02/13  0715   First MD Initiated Contact with Patient 02/02/13 [phone]      Chief Complaint  Patient presents with  . Diarrhea     The history is provided by the patient. No language interpreter was used.    Annette Armstrong is a 27 y.o. female who presents to the Emergency Department complaining of 4 days of diarrhea described as green and watery with associated fatigue and mild lower abdominal pain. She reports 6 episodes of diarrhea yesterday and one today upon waking. She states that she has an episode of diarrhea after every meal. She reports that she has been able to keep fluids down without difficulty. She confirms sick contacts at home with her son and brother having similar symptoms. She denies fevers, chills, nausea, emesis, CP and SOB as associated symptoms. She does not have a h/o chronic medical conditions. She is a current everyday smoker and occasional alcohol user.  PCP is Dr. Felecia Shelling  History reviewed. No pertinent past medical history.  History reviewed. No pertinent past surgical history.  No family history on file.  History  Substance Use Topics  . Smoking status: Current Every Day Smoker    Types: Cigarettes  . Smokeless tobacco: Not on file  . Alcohol Use: Yes     Comment: occ  works as a Lawyer  No OB history provided.  Review of Systems  A complete 10 system review of systems was obtained and all systems are negative except as noted in the HPI and PMH.   Allergies  Coconut flavor  Home Medications   Current Outpatient Rx  Name  Route  Sig  Dispense  Refill  . HYDROcodone-acetaminophen (NORCO/VICODIN) 5-325 MG per tablet      Take one-two tabs po q 4-6 hrs prn pain   6 tablet   0     Dispense pre-pack   . norelgestromin-ethinyl estradiol  (ORTHO EVRA) 150-20 MCG/24HR transdermal patch   Transdermal   Place 1 patch onto the skin once a week.   3 patch   9   . sulfamethoxazole-trimethoprim (SEPTRA DS) 800-160 MG per tablet   Oral   Take 1 tablet by mouth 2 (two) times daily.   28 tablet   0     Triage Vitals: BP 116/61  Pulse 77  Temp(Src) 98 F (36.7 C) (Oral)  Resp 16  Ht 5\' 3"  (1.6 m)  Wt 119 lb (53.978 kg)  BMI 21.09 kg/m2  SpO2 100%  LMP 12/29/2012  Physical Exam  Nursing note and vitals reviewed. Constitutional: She is oriented to person, place, and time. She appears well-developed and well-nourished. No distress.  HENT:  Head: Normocephalic and atraumatic.  Eyes: Conjunctivae and EOM are normal.  Neck: Neck supple. No tracheal deviation present.  Cardiovascular: Normal rate and regular rhythm.   Pulmonary/Chest: Effort normal and breath sounds normal. No respiratory distress.  Abdominal: She exhibits no distension. There is tenderness (slightly tender in the LLQ). There is no rebound and no guarding.  Musculoskeletal: Normal range of motion.  Neurological: She is alert and oriented to person, place, and time. No sensory deficit.  Skin: Skin is dry.  Psychiatric: She has a normal mood and affect. Her behavior is normal.    ED Course  Procedures (  including critical care time)  Medications  sodium chloride 0.9 % bolus 1,000 mL (not administered)  sodium chloride 0.9 % bolus 1,000 mL (not administered)  ondansetron (ZOFRAN) injection 4 mg (not administered)  pantoprazole (PROTONIX) injection 40 mg (not administered)    DIAGNOSTIC STUDIES: Oxygen Saturation is 100% on room air, normal by my interpretation.    COORDINATION OF CARE: 7:32 AM-Discussed treatment plan which includes IV fluids and medications with pt at bedside and pt agreed to plan.   Labs Reviewed - No data to display No results found.   No diagnosis found.    MDM  Patient feeling better after IV fluids, IV Zofran, IV  Protonix. Discharge medications Lomotil #20 Phenergan 25 mg #15. No acute abdomen    I personally performed the services described in this documentation, which was scribed in my presence. The recorded information has been reviewed and is accurate.     Donnetta Hutching, MD 02/02/13 1048

## 2013-08-03 ENCOUNTER — Other Ambulatory Visit (HOSPITAL_COMMUNITY): Payer: Self-pay | Admitting: Nurse Practitioner

## 2013-08-10 ENCOUNTER — Ambulatory Visit (HOSPITAL_COMMUNITY): Payer: Medicaid Other

## 2013-08-17 ENCOUNTER — Ambulatory Visit (HOSPITAL_COMMUNITY)
Admission: RE | Admit: 2013-08-17 | Discharge: 2013-08-17 | Disposition: A | Discharge status: Home / Self Care | Payer: Medicaid Other | Source: Ambulatory Visit | Attending: Nurse Practitioner | Admitting: Nurse Practitioner

## 2013-08-17 DIAGNOSIS — N63 Unspecified lump in unspecified breast: Secondary | ICD-10-CM | POA: Insufficient documentation

## 2013-10-26 ENCOUNTER — Emergency Department (HOSPITAL_COMMUNITY)
Admission: EM | Admit: 2013-10-26 | Discharge: 2013-10-26 | Disposition: A | Discharge status: Home / Self Care | Payer: BC Managed Care – PPO | Attending: Emergency Medicine | Admitting: Emergency Medicine

## 2013-10-26 ENCOUNTER — Encounter (HOSPITAL_COMMUNITY): Payer: Self-pay | Admitting: Emergency Medicine

## 2013-10-26 DIAGNOSIS — N831 Corpus luteum cyst of ovary, unspecified side: Secondary | ICD-10-CM | POA: Insufficient documentation

## 2013-10-26 DIAGNOSIS — N898 Other specified noninflammatory disorders of vagina: Secondary | ICD-10-CM | POA: Insufficient documentation

## 2013-10-26 DIAGNOSIS — N949 Unspecified condition associated with female genital organs and menstrual cycle: Secondary | ICD-10-CM | POA: Insufficient documentation

## 2013-10-26 DIAGNOSIS — R102 Pelvic and perineal pain: Secondary | ICD-10-CM

## 2013-10-26 DIAGNOSIS — Z3202 Encounter for pregnancy test, result negative: Secondary | ICD-10-CM | POA: Insufficient documentation

## 2013-10-26 DIAGNOSIS — F17200 Nicotine dependence, unspecified, uncomplicated: Secondary | ICD-10-CM | POA: Insufficient documentation

## 2013-10-26 LAB — WET PREP, GENITAL
Clue Cells Wet Prep HPF POC: NONE SEEN
Trich, Wet Prep: NONE SEEN
Yeast Wet Prep HPF POC: NONE SEEN

## 2013-10-26 LAB — URINALYSIS, ROUTINE W REFLEX MICROSCOPIC
Bilirubin Urine: NEGATIVE
Glucose, UA: NEGATIVE mg/dL
Hgb urine dipstick: NEGATIVE
Ketones, ur: NEGATIVE mg/dL
Leukocytes, UA: NEGATIVE
NITRITE: NEGATIVE
Protein, ur: NEGATIVE mg/dL
SPECIFIC GRAVITY, URINE: 1.025 (ref 1.005–1.030)
UROBILINOGEN UA: 1 mg/dL (ref 0.0–1.0)
pH: 6 (ref 5.0–8.0)

## 2013-10-26 LAB — PREGNANCY, URINE: PREG TEST UR: NEGATIVE

## 2013-10-26 MED ORDER — AZITHROMYCIN 1 G PO PACK
PACK | ORAL | Status: AC
  Administered 2013-10-26: 1 g
  Filled 2013-10-26: qty 1

## 2013-10-26 MED ORDER — LIDOCAINE HCL (PF) 1 % IJ SOLN
INTRAMUSCULAR | Status: AC
  Administered 2013-10-26: 0.9 mL
  Filled 2013-10-26: qty 5

## 2013-10-26 MED ORDER — CEFTRIAXONE SODIUM 250 MG IJ SOLR
250.0000 mg | Freq: Once | INTRAMUSCULAR | Status: AC
  Administered 2013-10-26: 250 mg via INTRAMUSCULAR
  Filled 2013-10-26: qty 250

## 2013-10-26 MED ORDER — AZITHROMYCIN 200 MG/5ML PO SUSR
1000.0000 mg | Freq: Once | ORAL | Status: AC
  Filled 2013-10-26: qty 25

## 2013-10-26 NOTE — Discharge Instructions (Signed)
Pelvic Pain, Female °Female pelvic pain can be caused by many different things and start from a variety of places. Pelvic pain refers to pain that is located in the lower half of the abdomen and between your hips. The pain may occur over a short period of time (acute) or may be reoccurring (chronic). The cause of pelvic pain may be related to disorders affecting the female reproductive organs (gynecologic), but it may also be related to the bladder, kidney stones, an intestinal complication, or muscle or skeletal problems. Getting help right away for pelvic pain is important, especially if there has been severe, sharp, or a sudden onset of unusual pain. It is also important to get help right away because some types of pelvic pain can be life threatening.  °CAUSES  °Below are only some of the causes of pelvic pain. The causes of pelvic pain can be in one of several categories.  °· Gynecologic. °· Pelvic inflammatory disease. °· Sexually transmitted infection. °· Ovarian cyst or a twisted ovarian ligament (ovarian torsion). °· Uterine lining that grows outside the uterus (endometriosis). °· Fibroids, cysts, or tumors. °· Ovulation. °· Pregnancy. °· Pregnancy that occurs outside the uterus (ectopic pregnancy). °· Miscarriage. °· Labor. °· Abruption of the placenta or ruptured uterus. °· Infection. °· Uterine infection (endometritis). °· Bladder infection. °· Diverticulitis. °· Miscarriage related to a uterine infection (septic abortion). °· Bladder. °· Inflammation of the bladder (cystitis). °· Kidney stone(s). °· Gastrointenstinal. °· Constipation. °· Diverticulitis. °· Neurologic. °· Trauma. °· Feeling pelvic pain because of mental or emotional causes (psychosomatic). °· Cancers of the bowel or pelvis. °EVALUATION  °Your caregiver will want to take a careful history of your concerns. This includes recent changes in your health, a careful gynecologic history of your periods (menses), and a sexual history. Obtaining  your family history and medical history is also important. Your caregiver may suggest a pelvic exam. A pelvic exam will help identify the location and severity of the pain. It also helps in the evaluation of which organ system may be involved. In order to identify the cause of the pelvic pain and be properly treated, your caregiver may order tests. These tests may include:  °· A pregnancy test. °· Pelvic ultrasonography. °· An X-ray exam of the abdomen. °· A urinalysis or evaluation of vaginal discharge. °· Blood tests. °HOME CARE INSTRUCTIONS  °· Only take over-the-counter or prescription medicines for pain, discomfort, or fever as directed by your caregiver.   °· Rest as directed by your caregiver.   °· Eat a balanced diet.   °· Drink enough fluids to make your urine clear or pale yellow, or as directed.   °· Avoid sexual intercourse if it causes pain.   °· Apply warm or cold compresses to the lower abdomen depending on which one helps the pain.   °· Avoid stressful situations.   °· Keep a journal of your pelvic pain. Write down when it started, where the pain is located, and if there are things that seem to be associated with the pain, such as food or your menstrual cycle. °· Follow up with your caregiver as directed.   °SEEK MEDICAL CARE IF: °· Your medicine does not help your pain. °· You have abnormal vaginal discharge. °SEEK IMMEDIATE MEDICAL CARE IF:  °· You have heavy bleeding from the vagina.   °· Your pelvic pain increases.   °· You feel lightheaded or faint.   °· You have chills.   °· You have pain with urination or blood in your urine.   °· You have uncontrolled   diarrhea or vomiting.   You have a fever or persistent symptoms for more than 3 days.  You have a fever and your symptoms suddenly get worse.   You are being physically or sexually abused.  MAKE SURE YOU:  Understand these instructions.  Will watch your condition.  Will get help if you are not doing well or get worse. Document  Released: 08/26/2004 Document Revised: 03/30/2012 Document Reviewed: 01/19/2012 [facility] Patient Information 2014 [place], [place]. Cervicitis Cervicitis is a soreness and swelling (inflammation) of the cervix. Your cervix is located at the bottom of your uterus. It opens up to the vagina. CAUSES   Sexually transmitted infections (STIs).   Allergic reaction.   Medicines or birth control devices that are put in the vagina.   Injury to the cervix.   Bacterial infections.  RISK FACTORS You are at greater risk if you:  Have unprotected sexual intercourse.  Have sexual intercourse with many partners.  Began sexual intercourse at an early age.  Have a history of STIs. SYMPTOMS  There may be no symptoms. If symptoms occur, they may include:   Grey, white, yellow, or bad-smelling vaginal discharge.   Pain or itching of the area outside the vagina.   Painful sexual intercourse.   Lower abdominal or lower back pain, especially during intercourse.   Frequent urination.   Abnormal vaginal bleeding between periods, after sexual intercourse, or after menopause.   Pressure or a heavy feeling in the pelvis.  DIAGNOSIS  Diagnosis is made after a pelvic exam. Other tests may include:   Examination of any discharge under a microscope (wet prep).   A Pap test.  TREATMENT  Treatment will depend on the cause of cervicitis. If it is caused by an STI, both you and your partner will need to be treated. Antibiotic medicines will be given.  HOME CARE INSTRUCTIONS   Do not have sexual intercourse until your health care provider says it is okay.   Do not have sexual intercourse until your partner has been treated, if your cervicitis is caused by an STI.   Take your antibiotics as directed. Finish them even if you start to feel better.  SEEK MEDICAL CARE IF:  Your symptoms come back.   You have a fever.  MAKE SURE YOU:   Understand these instructions.  Will watch  your condition.  Will get help right away if you are not doing well or get worse. Document Released: 09/29/2005 Document Revised: 06/01/2013 Document Reviewed: 03/23/2013 [facility] Patient Information 2014 [place], [place].

## 2013-10-26 NOTE — ED Notes (Signed)
Patient told to return to hospital for outpatient ultrasound at 1500 tomorrow. This nurse also instructed patient to drink a 32 oz glass of water PTA and to not urinate prior to ultrasound. Patient verbalized understanding.

## 2013-10-26 NOTE — ED Notes (Signed)
Left groin pain x 2 days. Took AZO and states felt a little better. Denies urinary changes. Denies vag d/c. Sitting in hot water makes pain better. Nad. lnbm this am.

## 2013-10-26 NOTE — ED Provider Notes (Signed)
CSN: [id number]     Arrival date & time 10/26/13  1627 History   First MD Initiated Contact with Patient 10/26/13 1816     Chief Complaint  Patient presents with  . Groin Pain   (Consider location/radiation/quality/duration/timing/severity/associated sxs/prior Treatment) Patient is a 28 y.o. female presenting with groin pain.  Groin Pain   Pt reports intermittent sharp L pelvic pain off and on for months, but worse in the last 2 days. Improved with AZO taken at home but has not been evaluated by MD. She denies any dysuria, vomiting, fever or diarrhea but reports some vaginal discharge. Had normal menses about 5 weeks ago.   History reviewed. No pertinent past medical history. History reviewed. No pertinent past surgical history. History reviewed. No pertinent family history. History  Substance Use Topics  . Smoking status: Current Every Day Smoker    Types: Cigarettes  . Smokeless tobacco: Not on file  . Alcohol Use: Yes     Comment: occ   OB History   Grav Para Term Preterm Abortions TAB SAB Ect Mult Living                 Review of Systems All other systems reviewed and are negative except as noted in HPI.   Allergies  Coconut flavor  Home Medications  No current outpatient prescriptions on file. BP 124/58  Pulse 74  Temp(Src) 98.5 F (36.9 C) (Oral)  Resp 17  SpO2 100%  LMP 09/18/2013 Physical Exam  Nursing note and vitals reviewed. Constitutional: She is oriented to person, place, and time. She appears well-developed and well-nourished.  HENT:  Head: Normocephalic and atraumatic.  Eyes: EOM are normal. Pupils are equal, round, and reactive to light.  Neck: Normal range of motion. Neck supple.  Cardiovascular: Normal rate, normal heart sounds and intact distal pulses.   Pulmonary/Chest: Effort normal and breath sounds normal.  Abdominal: Bowel sounds are normal. She exhibits no distension. There is no tenderness.  Genitourinary:  Thin frothy yellowish  discharge, no CMT, no bleeding. No adnexal mass, mild L adnexal tenderness  Musculoskeletal: Normal range of motion. She exhibits no edema and no tenderness.  Neurological: She is alert and oriented to person, place, and time. She has normal strength. No cranial nerve deficit or sensory deficit.  Skin: Skin is warm and dry. No rash noted.  Psychiatric: She has a normal mood and affect.    ED Course  Procedures (including critical care time) Labs Review Labs Reviewed  WET PREP, GENITAL - Abnormal; Notable for the following:    WBC, Wet Prep HPF POC FEW (*)    All other components within normal limits  GC/CHLAMYDIA PROBE AMP  PREGNANCY, URINE  URINALYSIS, ROUTINE W REFLEX MICROSCOPIC   Imaging Review No results found.  EKG Interpretation   None       MDM   1. Pelvic pain   2. Vaginal discharge     UA and hcg neg. Exam and timing of symptoms not consistent with ovarian torsion, doubt PID or TOA. Could be ovarian cyst, suspect Trich or other STD given exam findings. Will await Wet prep. Anticipate discharge with outpatient US and Gyn followup    Caly Pellum B. Bernette MayersSheldon, MD 10/26/13 2013

## 2013-10-27 ENCOUNTER — Ambulatory Visit (HOSPITAL_COMMUNITY)
Admit: 2013-10-27 | Discharge: 2013-10-27 | Disposition: A | Discharge status: Home / Self Care | Payer: BC Managed Care – PPO | Attending: Emergency Medicine | Admitting: Emergency Medicine

## 2013-10-27 ENCOUNTER — Other Ambulatory Visit (HOSPITAL_COMMUNITY): Payer: Self-pay | Admitting: Emergency Medicine

## 2013-10-27 DIAGNOSIS — R52 Pain, unspecified: Secondary | ICD-10-CM

## 2013-10-27 DIAGNOSIS — N949 Unspecified condition associated with female genital organs and menstrual cycle: Secondary | ICD-10-CM | POA: Insufficient documentation

## 2013-10-27 NOTE — ED Provider Notes (Signed)
Patient presented for Pelvic U/S for further evaluation of L groin pain. Imaging significant for L ovarian cyst with retracted clot/hemorrhage. Patient notified of her imaging results. She has no additional questions or concerns. Instructed to f/u with her OBGYN for reimaging in 6-10 weeks as needed. Ibuprofen for pain control.  CLINICAL DATA: Pelvic pain  EXAM:  TRANSABDOMINAL AND TRANSVAGINAL ULTRASOUND OF PELVIS  TECHNIQUE:  Both transabdominal and transvaginal ultrasound examinations of the  pelvis were performed. Transabdominal technique was performed for  global imaging of the pelvis including uterus, ovaries, adnexal  regions, and pelvic cul-de-sac. It was necessary to proceed with  endovaginal exam following the transabdominal exam to visualize the  endometrium.  COMPARISON: None  FINDINGS:  Uterus  Measurements: 7.8 x 4.5 x 5.4 cm. No fibroids or other mass  visualized.  Endometrium  Thickness: 11 mm. No focal abnormality visualized.  Right ovary  Measurements: 4.3 x 2.1 x 2.4 cm. Normal appearance/no adnexal mass.  Left ovary  Measurements: 5.8 x 4.1 x 3.8 cm. Associated 3.8 x 3.1 x 3.3 cm  probable corpus luteal cyst with associated retracted  clot/hemorrhage.  Other findings  Trace pelvic ascites.  IMPRESSION:  3.8 cm probable left ovarian corpus luteal cyst with retracted  clot/hemorrhage, likely physiologic.  Consider follow-up pelvic ultrasound in 6-10 weeks as clinically  warranted.  Electronically Signed  By: Charline BillsSriyesh Krishnan M.D.  On: 10/27/2013 15:54   Antony MaduraKelly Elaine Roanhorse, PA-C 10/27/13 1607

## 2013-10-28 LAB — GC/CHLAMYDIA PROBE AMP
CT Probe RNA: NEGATIVE
GC Probe RNA: NEGATIVE

## 2013-10-31 NOTE — ED Provider Notes (Signed)
Medical screening examination/treatment/procedure(s) were performed by non-physician practitioner and as supervising physician I was immediately available for consultation/collaboration.  EKG Interpretation   None         Gilda Creasehristopher J. Mayla Biddy, MD 10/31/13 [phone]

## 2014-03-02 ENCOUNTER — Other Ambulatory Visit (HOSPITAL_COMMUNITY): Payer: Self-pay | Admitting: Nurse Practitioner

## 2014-03-02 DIAGNOSIS — Z09 Encounter for follow-up examination after completed treatment for conditions other than malignant neoplasm: Secondary | ICD-10-CM

## 2014-03-08 ENCOUNTER — Ambulatory Visit (HOSPITAL_COMMUNITY): Admission: RE | Admit: 2014-03-08 | Payer: BC Managed Care – PPO | Source: Ambulatory Visit

## 2014-05-27 ENCOUNTER — Emergency Department (HOSPITAL_COMMUNITY)
Admission: EM | Admit: 2014-05-27 | Discharge: 2014-05-27 | Disposition: A | Discharge status: Home / Self Care | Payer: BC Managed Care – PPO | Attending: Emergency Medicine | Admitting: Emergency Medicine

## 2014-05-27 ENCOUNTER — Encounter (HOSPITAL_COMMUNITY): Payer: Self-pay | Admitting: Emergency Medicine

## 2014-05-27 DIAGNOSIS — F17200 Nicotine dependence, unspecified, uncomplicated: Secondary | ICD-10-CM | POA: Insufficient documentation

## 2014-05-27 DIAGNOSIS — R51 Headache: Secondary | ICD-10-CM | POA: Insufficient documentation

## 2014-05-27 DIAGNOSIS — Z791 Long term (current) use of non-steroidal anti-inflammatories (NSAID): Secondary | ICD-10-CM | POA: Insufficient documentation

## 2014-05-27 DIAGNOSIS — J029 Acute pharyngitis, unspecified: Secondary | ICD-10-CM | POA: Insufficient documentation

## 2014-05-27 MED ORDER — AMOXICILLIN 500 MG PO CAPS
500.0000 mg | ORAL_CAPSULE | Freq: Two times a day (BID) | ORAL | Status: DC
Start: 2014-05-27 — End: 2016-01-06

## 2014-05-27 MED ORDER — IBUPROFEN 800 MG PO TABS
800.0000 mg | ORAL_TABLET | Freq: Once | ORAL | Status: AC
  Administered 2014-05-27: 800 mg via ORAL
  Filled 2014-05-27: qty 1

## 2014-05-27 MED ORDER — AMOXICILLIN 250 MG PO CAPS
500.0000 mg | ORAL_CAPSULE | Freq: Once | ORAL | Status: AC
  Administered 2014-05-27: 500 mg via ORAL
  Filled 2014-05-27: qty 2

## 2014-05-27 NOTE — ED Notes (Signed)
Pt c/o sore throat since Thursday.  

## 2014-05-27 NOTE — ED Provider Notes (Signed)
Medical screening examination/treatment/procedure(s) were performed by non-physician practitioner and as supervising physician I was immediately available for consultation/collaboration.   EKG Interpretation None        Daquane Aguilar L Aron Inge, MD 05/27/14 1400 

## 2014-05-27 NOTE — Discharge Instructions (Signed)

## 2014-05-27 NOTE — ED Provider Notes (Signed)
CSN: [id number]     Arrival date & time 05/27/14  4098 History   First MD Initiated Contact with Patient 05/27/14 1040     Chief Complaint  Patient presents with  . Sore Throat     (Consider location/radiation/quality/duration/timing/severity/associated sxs/prior Treatment) Patient is a 28 y.o. female presenting with pharyngitis. The history is provided by the patient.  Sore Throat This is a new problem. The current episode started in the past 7 days. The problem occurs intermittently. The problem has been gradually worsening. Associated symptoms include headaches and a sore throat. Pertinent negatives include no abdominal pain, arthralgias, chest pain, chills, congestion, coughing, neck pain, rash or vomiting. The symptoms are aggravated by swallowing. She has tried nothing for the symptoms. The treatment provided no relief.    History reviewed. No pertinent past medical history. History reviewed. No pertinent past surgical history. No family history on file. History  Substance Use Topics  . Smoking status: Current Every Day Smoker    Types: Cigarettes  . Smokeless tobacco: Not on file  . Alcohol Use: Yes     Comment: occ   OB History   Grav Para Term Preterm Abortions TAB SAB Ect Mult Living                 Review of Systems  Constitutional: Negative for chills and activity change.       All ROS Neg except as noted in HPI  HENT: Positive for sore throat. Negative for congestion and nosebleeds.   Eyes: Negative for photophobia and discharge.  Respiratory: Negative for cough, shortness of breath and wheezing.   Cardiovascular: Negative for chest pain and palpitations.  Gastrointestinal: Negative for vomiting, abdominal pain and blood in stool.  Genitourinary: Negative for dysuria, frequency and hematuria.  Musculoskeletal: Negative for arthralgias, back pain and neck pain.  Skin: Negative.  Negative for rash.  Neurological: Positive for headaches. Negative for dizziness,  seizures and speech difficulty.  Psychiatric/Behavioral: Negative for hallucinations and confusion.      Allergies  Coconut flavor  Home Medications   Prior to Admission medications   Medication Sig Start Date End Date Taking? Authorizing Provider  ibuprofen (ADVIL,MOTRIN) 800 MG tablet Take 800 mg by mouth as needed for mild pain.   Yes Historical Provider, MD   BP 121/62  Pulse 67  Temp(Src) 98.1 F (36.7 C)  Resp 18  Ht 5\' 3"  (1.6 m)  Wt 118 lb (53.524 kg)  BMI 20.91 kg/m2  SpO2 100%  LMP 05/23/2014 Physical Exam  Nursing note and vitals reviewed. Constitutional: She is oriented to person, place, and time. She appears well-developed and well-nourished.  Non-toxic appearance.  HENT:  Head: Normocephalic.  Right Ear: Tympanic membrane and external ear normal.  Left Ear: Tympanic membrane and external ear normal.  There is mild to moderate increased redness of the posterior pharynx. Uvula is in the midline. No H. date appreciated.  Eyes: EOM and lids are normal. Pupils are equal, round, and reactive to light.  Neck: Normal range of motion. Neck supple. Carotid bruit is not present.  Cardiovascular: Normal rate, regular rhythm, normal heart sounds, intact distal pulses and normal pulses.   Pulmonary/Chest: Breath sounds normal. No respiratory distress.  Abdominal: Soft. Bowel sounds are normal. There is no tenderness. There is no guarding.  Musculoskeletal: Normal range of motion.  Lymphadenopathy:       Head (right side): No submandibular adenopathy present.       Head (left side): No submandibular adenopathy present.  She has no cervical adenopathy.  Neurological: She is alert and oriented to person, place, and time. She has normal strength. No cranial nerve deficit or sensory deficit.  Skin: Skin is warm and dry.  Psychiatric: She has a normal mood and affect. Her speech is normal.    ED Course  Procedures (including critical care time) Labs Review Labs Reviewed  - No data to display  Imaging Review No results found.   EKG Interpretation None      MDM Patient has increased redness and mild tonsillar enlargement. Increased redness of the posterior pharynx is noted. No abscess is appreciated. Vital signs are within normal limits. Pulse oximetry 100% on room air. Patient has pain with attempting to swallow. She has some aching when she moves her neck and also some muscle aching. Her graft the plan at this time is for the patient to be placed on Amoxil 500 mg 3 times daily and ibuprofen every 6 hours. Patient is advised to wash hands frequently and not to allow anyone to share her eating utensils. Patient is to return if any changes, problems, or concerns.    Final diagnoses:  None    **I have reviewed nursing notes, vital signs, and all appropriate lab and imaging results for this patient.Kathie Dike*    Tekia Waterbury M Shauntelle Jamerson, PA-C 05/27/14 1104

## 2014-08-27 ENCOUNTER — Encounter (HOSPITAL_COMMUNITY): Payer: Self-pay | Admitting: Cardiology

## 2014-08-27 ENCOUNTER — Emergency Department (HOSPITAL_COMMUNITY): Payer: BC Managed Care – PPO

## 2014-08-27 ENCOUNTER — Emergency Department (HOSPITAL_COMMUNITY)
Admission: EM | Admit: 2014-08-27 | Discharge: 2014-08-27 | Disposition: A | Discharge status: Home / Self Care | Payer: Self-pay | Attending: Emergency Medicine | Admitting: Emergency Medicine

## 2014-08-27 DIAGNOSIS — R52 Pain, unspecified: Secondary | ICD-10-CM

## 2014-08-27 DIAGNOSIS — Z720 Tobacco use: Secondary | ICD-10-CM | POA: Insufficient documentation

## 2014-08-27 DIAGNOSIS — K2100 Gastro-esophageal reflux disease with esophagitis, without bleeding: Secondary | ICD-10-CM

## 2014-08-27 DIAGNOSIS — Z792 Long term (current) use of antibiotics: Secondary | ICD-10-CM | POA: Insufficient documentation

## 2014-08-27 MED ORDER — RANITIDINE HCL 150 MG PO CAPS: 150.0000 mg | ORAL_CAPSULE | Freq: Every day | ORAL | Status: DC

## 2014-08-27 NOTE — ED Provider Notes (Signed)
CSN: [id number]     Arrival date & time 08/27/14  1055 History  This chart was scribed for Annette LennertJoseph L Decari Duggar, MD by Gwenyth Oberatherine Macek, ED Scribe. This patient was seen in room APA14/APA14 and the patient's care was started at 12:28 PM.    Chief Complaint  Patient presents with  . Chest Pain   Patient is a 28 y.o. female presenting with chest pain. The history is provided by the patient. No language interpreter was used.  Chest Pain Pain location:  Substernal area Pain radiates to:  Does not radiate Pain radiates to the back: no   Pain severity:  Mild Onset quality:  Gradual Duration:  2 days Timing:  Intermittent Progression:  Unchanged Chronicity:  New Context: not breathing   Relieved by:  Nothing Worsened by:  Nothing tried Ineffective treatments:  None tried Associated symptoms: no abdominal pain, no back pain, no cough, no fatigue and no headache     HPI Comments: Annette Armstrong is a 28 y.o. female who presents to the Emergency Department complaining of intermittent, midsternal chest pain that started 2 days ago. Pt states she has cold symptoms of chest congestion and rhinorrhea and has been taking Tylenol and cold medicine with no relief. She denies pain with breathing as an associated symptom.  History reviewed. No pertinent past medical history. History reviewed. No pertinent past surgical history. History reviewed. No pertinent family history. History  Substance Use Topics  . Smoking status: Current Every Day Smoker    Types: Cigarettes  . Smokeless tobacco: Not on file  . Alcohol Use: Yes     Comment: occ   OB History    No data available     Review of Systems  Constitutional: Negative for appetite change and fatigue.  HENT: Negative for congestion, ear discharge and sinus pressure.   Eyes: Negative for discharge.  Respiratory: Negative for cough.   Cardiovascular: Positive for chest pain.  Gastrointestinal: Negative for abdominal pain and diarrhea.  Genitourinary:  Negative for frequency and hematuria.  Musculoskeletal: Negative for back pain.  Skin: Negative for rash.  Neurological: Negative for seizures and headaches.  Psychiatric/Behavioral: Negative for hallucinations.    Allergies  Coconut flavor  Home Medications   Prior to Admission medications   Medication Sig Start Date End Date Taking? Authorizing Provider  amoxicillin (AMOXIL) 500 MG capsule Take 1 capsule (500 mg total) by mouth 2 (two) times daily. 05/27/14   Kathie DikeHobson M Bryant, PA-C  ibuprofen (ADVIL,MOTRIN) 800 MG tablet Take 800 mg by mouth as needed for mild pain.    Historical Provider, MD   BP 120/66 mmHg  Pulse 77  Temp(Src) 98.6 F (37 C) (Oral)  Resp 18  Ht 5\' 2"  (1.575 m)  Wt 116 lb (52.617 kg)  BMI 21.21 kg/m2  SpO2 100%  LMP 08/14/2014 Physical Exam  Constitutional: She is oriented to person, place, and time. She appears well-developed.  HENT:  Head: Normocephalic.  Eyes: Conjunctivae and EOM are normal. No scleral icterus.  Neck: Neck supple. No thyromegaly present.  Cardiovascular: Normal rate and regular rhythm.  Exam reveals no gallop and no friction rub.   No murmur heard. Pulmonary/Chest: No stridor. She has no wheezes. She has no rales. She exhibits no tenderness.  Abdominal: She exhibits no distension. There is no tenderness. There is no rebound.  Musculoskeletal: Normal range of motion. She exhibits no edema.  Lymphadenopathy:    She has no cervical adenopathy.  Neurological: She is oriented to person, place,  and time. She exhibits normal muscle tone. Coordination normal.  Skin: No rash noted. No erythema.  Psychiatric: She has a normal mood and affect. Her behavior is normal.  Nursing note and vitals reviewed.   ED Course  Procedures (including critical care time)  DIAGNOSTIC STUDIES: Oxygen Saturation is 100% on RA, normal by my interpretation.    COORDINATION OF CARE: 12:31 PM Discussed treatment plan with pt at bedside and pt agreed to  plan.  Labs Review Labs Reviewed - No data to display  Imaging Review No results found.   EKG Interpretation   Date/Time:  Sunday August 27 2014 11:04:33 EST Ventricular Rate:  68 PR Interval:  122 QRS Duration: 82 QT Interval:  360 QTC Calculation: 382 R Axis:   64 Text Interpretation:  Normal sinus rhythm Normal ECG Confirmed by Kennedy Brines   MD, Annette Armstrong [phone]([id number]) on 08/27/2014 2:55:09 PM      MDM   Final diagnoses:  None    Gerd,  tx with zantac and follow up pcp  The chart was scribed for me under my direct supervision.  I personally performed the history, physical, and medical decision making and all procedures in the evaluation of this patient.Annette Armstrong.      Annette Rief L Keymora Grillot, MD 08/27/14 [phone]

## 2014-08-27 NOTE — ED Notes (Signed)
Chest pain midsternal times 2 days.  Worse when she breathes in.  Has had some head congestion.

## 2014-08-27 NOTE — Discharge Instructions (Signed)
Follow up with your md in 1-2 weeks for recheck °

## 2014-10-30 ENCOUNTER — Encounter: Payer: Self-pay | Admitting: Obstetrics & Gynecology

## 2014-10-30 ENCOUNTER — Other Ambulatory Visit: Payer: Self-pay | Admitting: Obstetrics & Gynecology

## 2014-10-30 ENCOUNTER — Ambulatory Visit (INDEPENDENT_AMBULATORY_CARE_PROVIDER_SITE_OTHER): Payer: 59 | Admitting: Obstetrics & Gynecology

## 2014-10-30 ENCOUNTER — Ambulatory Visit (INDEPENDENT_AMBULATORY_CARE_PROVIDER_SITE_OTHER): Payer: 59

## 2014-10-30 VITALS — BP 110/80 | Ht 63.0 in | Wt 117.0 lb

## 2014-10-30 DIAGNOSIS — N83202 Unspecified ovarian cyst, left side: Secondary | ICD-10-CM

## 2014-10-30 DIAGNOSIS — R102 Pelvic and perineal pain: Secondary | ICD-10-CM

## 2014-10-30 DIAGNOSIS — N8320 Unspecified ovarian cysts: Secondary | ICD-10-CM

## 2014-10-30 DIAGNOSIS — R1031 Right lower quadrant pain: Secondary | ICD-10-CM

## 2014-10-30 DIAGNOSIS — R1032 Left lower quadrant pain: Principal | ICD-10-CM

## 2014-10-30 DIAGNOSIS — G8929 Other chronic pain: Secondary | ICD-10-CM

## 2014-10-30 MED ORDER — MEGESTROL ACETATE 40 MG PO TABS: 40.0000 mg | ORAL_TABLET | Freq: Every day | ORAL | Status: DC

## 2014-10-30 NOTE — Progress Notes (Signed)
Patient ID: Annette Armstrong, female   DOB: 09/28/1986, 29 y.o.   MRN: [id number] Pt with chronic LLQ pain about 10 years No dyspareunia No associated with period Sharp Regular BM qAM after coffee and cigarette  Blood pressure 110/80, height 5\' 3"  (1.6 m), weight 117 lb (53.071 kg), last menstrual period 09/17/2014.  No burning with urination, frequency or urgency No nausea, vomiting or diarrhea Nor fever chills or other constitutional symptoms  [place] Pelvis Complete  10/30/2014   GYNECOLOGIC SONOGRAM   Annette Piggnna M Wentling is a 29 y.o. LMP 09/17/2014 for a pelvic sonogram for Pelvic  pain and previous [facility]  Uterus                      8.7 x 5.2 x 4.6 cm, anteverted  Endometrium          6.0 mm, symmetrical,   Right ovary             4.4 x 3.2 x 2.7 cm,   Left ovary                3.1 x 2.3 x 1.8 cm,   No free fluid or adnexal masses noted within the pelvis  Technician Comments:  Anteverted uterus, endometrial cavity symmetrical=6.170mm, bilateral adnexa  appears WNL, no free fluid or adnexal masses noted within the pelvis   Chari ManningMcBride, Tasha 10/30/2014 4:15 PM  Clinical Impression and recommendations:  I have reviewed the sonogram results above, combined with the patient's  current clinical course, below are my impressions and any appropriate  recommendations for management based on the sonographic findings.  resolved interval left ovarian cyst Normal gyn anatomy   Kalei Meda H 10/30/2014 4:31 PM      Chronic LLQ pain  Try a trial of megestrol and recheck 6 weeks

## 2014-12-12 ENCOUNTER — Ambulatory Visit: Payer: 59 | Admitting: Obstetrics & Gynecology

## 2015-03-25 ENCOUNTER — Emergency Department (HOSPITAL_COMMUNITY)
Admission: EM | Admit: 2015-03-25 | Discharge: 2015-03-25 | Disposition: A | Discharge status: Home / Self Care | Payer: Self-pay | Attending: Emergency Medicine | Admitting: Emergency Medicine

## 2015-03-25 ENCOUNTER — Encounter (HOSPITAL_COMMUNITY): Payer: Self-pay | Admitting: Emergency Medicine

## 2015-03-25 DIAGNOSIS — S50862A Insect bite (nonvenomous) of left forearm, initial encounter: Secondary | ICD-10-CM | POA: Insufficient documentation

## 2015-03-25 DIAGNOSIS — Z720 Tobacco use: Secondary | ICD-10-CM | POA: Insufficient documentation

## 2015-03-25 DIAGNOSIS — W57XXXA Bitten or stung by nonvenomous insect and other nonvenomous arthropods, initial encounter: Secondary | ICD-10-CM | POA: Insufficient documentation

## 2015-03-25 DIAGNOSIS — Y92009 Unspecified place in unspecified non-institutional (private) residence as the place of occurrence of the external cause: Secondary | ICD-10-CM | POA: Insufficient documentation

## 2015-03-25 DIAGNOSIS — Y9389 Activity, other specified: Secondary | ICD-10-CM | POA: Insufficient documentation

## 2015-03-25 DIAGNOSIS — Z8742 Personal history of other diseases of the female genital tract: Secondary | ICD-10-CM | POA: Insufficient documentation

## 2015-03-25 DIAGNOSIS — S50861A Insect bite (nonvenomous) of right forearm, initial encounter: Secondary | ICD-10-CM | POA: Insufficient documentation

## 2015-03-25 DIAGNOSIS — S30860A Insect bite (nonvenomous) of lower back and pelvis, initial encounter: Secondary | ICD-10-CM | POA: Insufficient documentation

## 2015-03-25 DIAGNOSIS — S90562A Insect bite (nonvenomous), left ankle, initial encounter: Secondary | ICD-10-CM | POA: Insufficient documentation

## 2015-03-25 DIAGNOSIS — Z79899 Other long term (current) drug therapy: Secondary | ICD-10-CM | POA: Insufficient documentation

## 2015-03-25 DIAGNOSIS — Y998 Other external cause status: Secondary | ICD-10-CM | POA: Insufficient documentation

## 2015-03-25 DIAGNOSIS — S30861A Insect bite (nonvenomous) of abdominal wall, initial encounter: Secondary | ICD-10-CM | POA: Insufficient documentation

## 2015-03-25 DIAGNOSIS — Z793 Long term (current) use of hormonal contraceptives: Secondary | ICD-10-CM | POA: Insufficient documentation

## 2015-03-25 DIAGNOSIS — S90561A Insect bite (nonvenomous), right ankle, initial encounter: Secondary | ICD-10-CM | POA: Insufficient documentation

## 2015-03-25 MED ORDER — FAMOTIDINE 20 MG PO TABS
20.0000 mg | ORAL_TABLET | Freq: Once | ORAL | Status: AC
  Administered 2015-03-25: 20 mg via ORAL
  Filled 2015-03-25: qty 1

## 2015-03-25 MED ORDER — PREDNISONE 20 MG PO TABS
40.0000 mg | ORAL_TABLET | Freq: Once | ORAL | Status: AC
  Administered 2015-03-25: 40 mg via ORAL
  Filled 2015-03-25: qty 2

## 2015-03-25 MED ORDER — PREDNISONE 10 MG PO TABS: 20.0000 mg | ORAL_TABLET | Freq: Two times a day (BID) | ORAL | Status: DC

## 2015-03-25 MED ORDER — LORATADINE 10 MG PO TABS: 10.0000 mg | ORAL_TABLET | Freq: Every day | ORAL | Status: DC

## 2015-03-25 MED ORDER — LORATADINE 10 MG PO TABS
10.0000 mg | ORAL_TABLET | Freq: Once | ORAL | Status: AC
  Administered 2015-03-25: 10 mg via ORAL
  Filled 2015-03-25: qty 1

## 2015-03-25 MED ORDER — FAMOTIDINE 20 MG PO TABS: 20.0000 mg | ORAL_TABLET | Freq: Two times a day (BID) | ORAL | Status: DC

## 2015-03-25 NOTE — Discharge Instructions (Signed)
You may take benadryl in addition to the other medications if needed for itching.

## 2015-03-25 NOTE — ED Notes (Signed)
PT c/o rash to lower back and bilateral arms x1 day. PT just c/o of itching.

## 2015-03-25 NOTE — ED Provider Notes (Signed)
CSN: [id number]     Arrival date & time 03/25/15  1602 History  This chart was scribed for non-physician practitioner, [facility]. Damian Leavell, NP,  working with Raeford Razor, MD, by Roxy Cedar ED Scribe. This patient was seen in room APFT23/APFT23 and the patient's care was started at 6:01 PM    Chief Complaint  Patient presents with  . Rash   Patient is a 29 y.o. female presenting with rash. The history is provided by the patient. No language interpreter was used.  Rash  HPI Comments: Annette Armstrong is a 29 y.o. female who presents to the Emergency Department complaining of moderate rash to lower back and bilateral arms with associated itchiness that began last night. Rash began in legs and gradually spread to back and arms. Patient states that she acquired rash from sleeping in the basement of a friend's house and felt an unknown insect/bedbug biting her. Denies associated wheezing or SOB.  Past Medical History  Diagnosis Date  . Breast disorder    Past Surgical History  Procedure Laterality Date  . Cesarean section     Family History  Problem Relation Age of Onset  . Hypertension Mother    History  Substance Use Topics  . Smoking status: Current Every Day Smoker -- 0.50 packs/day    Types: Cigarettes  . Smokeless tobacco: Not on file  . Alcohol Use: Yes     Comment: occ   OB History    No data available     Review of Systems  Skin: Positive for rash (to legs, arms and back).  all other systems negative  Allergies  Coconut flavor  Home Medications   Prior to Admission medications   Medication Sig Start Date End Date Taking? Authorizing Provider  acetaminophen (TYLENOL) 500 MG tablet Take 1,000 mg by mouth every 6 (six) hours as needed for moderate pain.    Historical Provider, MD  amoxicillin (AMOXIL) 500 MG capsule Take 1 capsule (500 mg total) by mouth 2 (two) times daily. Patient not taking: Reported on 08/27/2014 05/27/14   Ivery Quale, PA-C   Chlorpheniramine-Acetaminophen (CORICIDIN HBP COLD/FLU PO) Take 2 tablets by mouth every 4 (four) hours as needed (COUGH/CONGESTION/COLD).    Historical Provider, MD  famotidine (PEPCID) 20 MG tablet Take 1 tablet (20 mg total) by mouth 2 (two) times daily. 03/25/15   Hope Orlene Och, NP  ibuprofen (ADVIL,MOTRIN) 800 MG tablet Take 800 mg by mouth as needed for mild pain.    Historical Provider, MD  loratadine (CLARITIN) 10 MG tablet Take 1 tablet (10 mg total) by mouth daily. 03/25/15   Hope Orlene Och, NP  megestrol (MEGACE) 40 MG tablet Take 1 tablet (40 mg total) by mouth daily. 10/30/14   Lazaro Arms, MD  predniSONE (DELTASONE) 10 MG tablet Take 2 tablets (20 mg total) by mouth 2 (two) times daily with a meal. 03/25/15   Hope Orlene Och, NP  ranitidine (ZANTAC) 150 MG capsule Take 1 capsule (150 mg total) by mouth daily. Patient not taking: Reported on 10/30/2014 08/27/14   Bethann Berkshire, MD   Triage Vitals: BP 118/51 mmHg  Pulse 66  Temp(Src) 98.3 F (36.8 C) (Oral)  Resp 20  SpO2 100%  LMP 03/14/2015  Physical Exam  Constitutional: She is oriented to person, place, and time. She appears well-developed and well-nourished. No distress.  HENT:  Head: Normocephalic and atraumatic.  Mouth/Throat: No posterior oropharyngeal edema or posterior oropharyngeal erythema.  Uvula is midline.  Eyes: Conjunctivae and  EOM are normal.  Neck: Neck supple. No tracheal deviation present.  Cardiovascular: Normal rate, regular rhythm and normal heart sounds.   Pulmonary/Chest: Effort normal and breath sounds normal. No respiratory distress.  Musculoskeletal: Normal range of motion.  Neurological: She is alert and oriented to person, place, and time.  Skin: Skin is warm and dry. Rash noted.  Multiple small red raised areas around the ankles, on the forearms and a few areas to the lower abdomen consistent with insect bites.  Psychiatric: She has a normal mood and affect. Her behavior is normal.  Nursing note  and vitals reviewed.  ED Course  Procedures (including critical care time)  DIAGNOSTIC STUDIES: Oxygen Saturation is 100% on RA, normal by my interpretation.    COORDINATION OF CARE: 6:04 PM- Discussed plans to give patient prednisone, Claritin and Pepcid. Advised pt to take Benadryl as needed. Pt advised of plan for treatment and pt agrees.  MDM  29 y.o. female with multiple insect bites and itching. Will treat for allergic dermatitis due to insect bites. Discussed with the patient and all questioned fully answered. She will return if any problems arise. Stable for d/c without respiratory symptoms and no swelling of her throat.   Final diagnoses:  Insect bites   I personally performed the services described in this documentation, which was scribed in my presence. The recorded information has been reviewed and is accurate.   [address] [place], NP 03/25/15 1826  Raeford Razor, MD 03/26/15 1535

## 2015-05-03 ENCOUNTER — Emergency Department (HOSPITAL_COMMUNITY)
Admission: EM | Admit: 2015-05-03 | Discharge: 2015-05-03 | Disposition: A | Discharge status: Home / Self Care | Payer: Self-pay | Attending: Emergency Medicine | Admitting: Emergency Medicine

## 2015-05-03 ENCOUNTER — Encounter (HOSPITAL_COMMUNITY): Payer: Self-pay | Admitting: Emergency Medicine

## 2015-05-03 DIAGNOSIS — Z720 Tobacco use: Secondary | ICD-10-CM | POA: Insufficient documentation

## 2015-05-03 DIAGNOSIS — T63441A Toxic effect of venom of bees, accidental (unintentional), initial encounter: Secondary | ICD-10-CM | POA: Insufficient documentation

## 2015-05-03 DIAGNOSIS — Y929 Unspecified place or not applicable: Secondary | ICD-10-CM | POA: Insufficient documentation

## 2015-05-03 DIAGNOSIS — Y999 Unspecified external cause status: Secondary | ICD-10-CM | POA: Insufficient documentation

## 2015-05-03 DIAGNOSIS — Z7952 Long term (current) use of systemic steroids: Secondary | ICD-10-CM | POA: Insufficient documentation

## 2015-05-03 DIAGNOSIS — Z79899 Other long term (current) drug therapy: Secondary | ICD-10-CM | POA: Insufficient documentation

## 2015-05-03 DIAGNOSIS — Z23 Encounter for immunization: Secondary | ICD-10-CM | POA: Insufficient documentation

## 2015-05-03 DIAGNOSIS — Z8742 Personal history of other diseases of the female genital tract: Secondary | ICD-10-CM | POA: Insufficient documentation

## 2015-05-03 DIAGNOSIS — Y939 Activity, unspecified: Secondary | ICD-10-CM | POA: Insufficient documentation

## 2015-05-03 DIAGNOSIS — T63444A Toxic effect of venom of bees, undetermined, initial encounter: Secondary | ICD-10-CM

## 2015-05-03 MED ORDER — TETANUS-DIPHTH-ACELL PERTUSSIS 5-2.5-18.5 LF-MCG/0.5 IM SUSP
0.5000 mL | Freq: Once | INTRAMUSCULAR | Status: AC
  Administered 2015-05-03: 0.5 mL via INTRAMUSCULAR
  Filled 2015-05-03: qty 0.5

## 2015-05-03 NOTE — ED Provider Notes (Signed)
CSN: [id number]     Arrival date & time 05/03/15  5784 History   First MD Initiated Contact with Patient 05/03/15 [phone]     Chief Complaint  Patient presents with  . Insect Bite     (Consider location/radiation/quality/duration/timing/severity/associated sxs/prior Treatment) HPI Comments: 29 year old female who reports being stung by a wasp 6 days ago. She states that she has some ongoing numbness and swelling of the finger and thinks that the stinger stuck in it. She has been taking Benadryl intermittently. She denies any other treatment. She denies any difficulty speaking, swallowing, breathing, lightheadedness, nausea, vomiting, or diarrhea.  The history is provided by the patient. No language interpreter was used.    Past Medical History  Diagnosis Date  . Breast disorder    Past Surgical History  Procedure Laterality Date  . Cesarean section     Family History  Problem Relation Age of Onset  . Hypertension Mother    History  Substance Use Topics  . Smoking status: Current Every Day Smoker -- 0.50 packs/day    Types: Cigarettes  . Smokeless tobacco: Not on file  . Alcohol Use: Yes     Comment: occ   OB History    No data available     Review of Systems  All other systems reviewed and are negative.     Allergies  Coconut flavor  Home Medications   Prior to Admission medications   Medication Sig Start Date End Date Taking? Authorizing Provider  acetaminophen (TYLENOL) 500 MG tablet Take 1,000 mg by mouth every 6 (six) hours as needed for moderate pain.    Historical Provider, MD  amoxicillin (AMOXIL) 500 MG capsule Take 1 capsule (500 mg total) by mouth 2 (two) times daily. Patient not taking: Reported on 08/27/2014 05/27/14   Ivery Quale, PA-C  Chlorpheniramine-Acetaminophen (CORICIDIN HBP COLD/FLU PO) Take 2 tablets by mouth every 4 (four) hours as needed (COUGH/CONGESTION/COLD).    Historical Provider, MD  famotidine (PEPCID) 20 MG tablet Take 1 tablet (20  mg total) by mouth 2 (two) times daily. 03/25/15   Hope Orlene Och, NP  ibuprofen (ADVIL,MOTRIN) 800 MG tablet Take 800 mg by mouth as needed for mild pain.    Historical Provider, MD  loratadine (CLARITIN) 10 MG tablet Take 1 tablet (10 mg total) by mouth daily. 03/25/15   Hope Orlene Och, NP  megestrol (MEGACE) 40 MG tablet Take 1 tablet (40 mg total) by mouth daily. 10/30/14   Lazaro Arms, MD  predniSONE (DELTASONE) 10 MG tablet Take 2 tablets (20 mg total) by mouth 2 (two) times daily with a meal. 03/25/15   Hope Orlene Och, NP  ranitidine (ZANTAC) 150 MG capsule Take 1 capsule (150 mg total) by mouth daily. Patient not taking: Reported on 10/30/2014 08/27/14   Bethann Berkshire, MD   BP 118/57 mmHg  Pulse 69  Temp(Src) 98.4 F (36.9 C) (Oral)  Resp 18  Ht 5\' 3"  (1.6 m)  Wt 121 lb (54.885 kg)  BMI 21.44 kg/m2  SpO2 100%  LMP 03/20/2015 Physical Exam  Constitutional: She appears well-developed and well-nourished. No distress.  HENT:  Head: Normocephalic.  Neck: Normal range of motion.  Musculoskeletal:       Hands: Mildly erythematous area with no fluctuance. No evidence of foreign body noted.  Nursing note and vitals reviewed.   ED Course  Procedures (including critical care time) Labs Review Labs Reviewed - No data to display  Imaging Review No results found.   EKG Interpretation  None      MDM   Final diagnoses:  Bee sting, undetermined intent, initial encounter        Margarita Grizzle, MD 05/03/15 [phone]

## 2015-05-03 NOTE — Discharge Instructions (Signed)

## 2015-05-03 NOTE — ED Notes (Signed)
Pt reports was stung by a "wasp" last Friday on left ring finger. Pt reports intermittent swelling,itching and numbness to left finger. Mild swelling noted in triage.

## 2015-07-01 ENCOUNTER — Emergency Department (HOSPITAL_COMMUNITY)
Admission: EM | Admit: 2015-07-01 | Discharge: 2015-07-01 | Disposition: A | Discharge status: Home / Self Care | Payer: Self-pay | Attending: Emergency Medicine | Admitting: Emergency Medicine

## 2015-07-01 ENCOUNTER — Emergency Department (HOSPITAL_COMMUNITY): Payer: Self-pay

## 2015-07-01 ENCOUNTER — Encounter (HOSPITAL_COMMUNITY): Payer: Self-pay | Admitting: Emergency Medicine

## 2015-07-01 DIAGNOSIS — Z79899 Other long term (current) drug therapy: Secondary | ICD-10-CM | POA: Insufficient documentation

## 2015-07-01 DIAGNOSIS — R300 Dysuria: Secondary | ICD-10-CM | POA: Insufficient documentation

## 2015-07-01 DIAGNOSIS — R1033 Periumbilical pain: Secondary | ICD-10-CM | POA: Insufficient documentation

## 2015-07-01 DIAGNOSIS — Z3202 Encounter for pregnancy test, result negative: Secondary | ICD-10-CM | POA: Insufficient documentation

## 2015-07-01 DIAGNOSIS — Z7952 Long term (current) use of systemic steroids: Secondary | ICD-10-CM | POA: Insufficient documentation

## 2015-07-01 DIAGNOSIS — R1012 Left upper quadrant pain: Secondary | ICD-10-CM | POA: Insufficient documentation

## 2015-07-01 DIAGNOSIS — R000 Tachycardia, unspecified: Secondary | ICD-10-CM | POA: Insufficient documentation

## 2015-07-01 DIAGNOSIS — N898 Other specified noninflammatory disorders of vagina: Secondary | ICD-10-CM | POA: Insufficient documentation

## 2015-07-01 DIAGNOSIS — R1032 Left lower quadrant pain: Secondary | ICD-10-CM | POA: Insufficient documentation

## 2015-07-01 DIAGNOSIS — Z720 Tobacco use: Secondary | ICD-10-CM | POA: Insufficient documentation

## 2015-07-01 DIAGNOSIS — R1031 Right lower quadrant pain: Secondary | ICD-10-CM | POA: Insufficient documentation

## 2015-07-01 DIAGNOSIS — R109 Unspecified abdominal pain: Secondary | ICD-10-CM

## 2015-07-01 LAB — COMPREHENSIVE METABOLIC PANEL
ALT: 15 U/L (ref 14–54)
ANION GAP: 7 (ref 5–15)
AST: 18 U/L (ref 15–41)
Albumin: 4.1 g/dL (ref 3.5–5.0)
Alkaline Phosphatase: 65 U/L (ref 38–126)
BILIRUBIN TOTAL: 1.2 mg/dL (ref 0.3–1.2)
BUN: 10 mg/dL (ref 6–20)
CO2: 25 mmol/L (ref 22–32)
Calcium: 8.8 mg/dL — ABNORMAL LOW (ref 8.9–10.3)
Chloride: 104 mmol/L (ref 101–111)
Creatinine, Ser: 0.7 mg/dL (ref 0.44–1.00)
GFR calc Af Amer: >60 mL/min (ref >60)
GLUCOSE: 91 mg/dL (ref 65–99)
Potassium: 3.4 mmol/L — ABNORMAL LOW (ref 3.5–5.1)
Sodium: 136 mmol/L (ref 135–145)
TOTAL PROTEIN: 7.6 g/dL (ref 6.5–8.1)

## 2015-07-01 LAB — URINALYSIS, ROUTINE W REFLEX MICROSCOPIC
BILIRUBIN URINE: NEGATIVE
Glucose, UA: NEGATIVE mg/dL
HGB URINE DIPSTICK: NEGATIVE
Ketones, ur: 15 mg/dL — AB
Nitrite: NEGATIVE
PH: 7 (ref 5.0–8.0)
Protein, ur: NEGATIVE mg/dL
Specific Gravity, Urine: 1.02 (ref 1.005–1.030)
UROBILINOGEN UA: 2 mg/dL — AB (ref 0.0–1.0)

## 2015-07-01 LAB — WET PREP, GENITAL
Clue Cells Wet Prep HPF POC: NONE SEEN
Trich, Wet Prep: NONE SEEN
WBC, Wet Prep HPF POC: NONE SEEN
YEAST WET PREP: NONE SEEN

## 2015-07-01 LAB — CBC WITH DIFFERENTIAL/PLATELET
Basophils Absolute: 0 10*3/uL (ref 0.0–0.1)
Basophils Relative: 0 %
Eosinophils Absolute: 0.1 10*3/uL (ref 0.0–0.7)
Eosinophils Relative: 0 %
HEMATOCRIT: 40.6 % (ref 36.0–46.0)
HEMOGLOBIN: 14 g/dL (ref 12.0–15.0)
LYMPHS ABS: 0.8 10*3/uL (ref 0.7–4.0)
LYMPHS PCT: 5 %
MCH: 28.7 pg (ref 26.0–34.0)
MCHC: 34.5 g/dL (ref 30.0–36.0)
MCV: 83.4 fL (ref 78.0–100.0)
MONO ABS: 0.6 10*3/uL (ref 0.1–1.0)
MONOS PCT: 4 %
NEUTROS ABS: 14.9 10*3/uL — AB (ref 1.7–7.7)
NEUTROS PCT: 91 %
Platelets: 201 10*3/uL (ref 150–400)
RBC: 4.87 MIL/uL (ref 3.87–5.11)
RDW: 13.7 % (ref 11.5–15.5)
WBC: 16.4 10*3/uL — ABNORMAL HIGH (ref 4.0–10.5)

## 2015-07-01 LAB — URINE MICROSCOPIC-ADD ON

## 2015-07-01 LAB — LIPASE, BLOOD: Lipase: 10 U/L — ABNORMAL LOW (ref 22–51)

## 2015-07-01 LAB — PREGNANCY, URINE: PREG TEST UR: NEGATIVE

## 2015-07-01 MED ORDER — IOHEXOL 300 MG/ML  SOLN
25.0000 mL | Freq: Once | INTRAMUSCULAR | Status: AC | PRN
  Administered 2015-07-01: 25 mL via ORAL

## 2015-07-01 MED ORDER — IOHEXOL 300 MG/ML  SOLN
100.0000 mL | Freq: Once | INTRAMUSCULAR | Status: AC | PRN
  Administered 2015-07-01: 100 mL via INTRAVENOUS

## 2015-07-01 MED ORDER — ONDANSETRON HCL 4 MG/2ML IJ SOLN
4.0000 mg | Freq: Once | INTRAMUSCULAR | Status: AC
Start: 2015-07-01 — End: 2015-07-01
  Administered 2015-07-01: 4 mg via INTRAVENOUS
  Filled 2015-07-01: qty 2

## 2015-07-01 MED ORDER — TRAMADOL HCL 50 MG PO TABS
50.0000 mg | ORAL_TABLET | Freq: Once | ORAL | Status: AC
  Administered 2015-07-01: 50 mg via ORAL
  Filled 2015-07-01: qty 1

## 2015-07-01 MED ORDER — IBUPROFEN 800 MG PO TABS
800.0000 mg | ORAL_TABLET | Freq: Once | ORAL | Status: AC
  Administered 2015-07-01: 800 mg via ORAL
  Filled 2015-07-01: qty 1

## 2015-07-01 MED ORDER — FENTANYL CITRATE (PF) 100 MCG/2ML IJ SOLN
50.0000 ug | Freq: Once | INTRAMUSCULAR | Status: AC
  Administered 2015-07-01: 50 ug via INTRAVENOUS
  Filled 2015-07-01: qty 2

## 2015-07-01 MED ORDER — SODIUM CHLORIDE 0.9 % IJ SOLN
INTRAMUSCULAR | Status: AC
  Filled 2015-07-01: qty 500

## 2015-07-01 MED ORDER — TRAMADOL HCL 50 MG PO TABS: ORAL_TABLET | ORAL | Status: DC

## 2015-07-01 MED ORDER — CEPHALEXIN 500 MG PO CAPS
500.0000 mg | ORAL_CAPSULE | Freq: Four times a day (QID) | ORAL | Status: DC
Start: 2015-07-01 — End: 2016-01-06

## 2015-07-01 NOTE — Discharge Instructions (Signed)
Your urine test is somewhat abnormal. We will send a culture to the lab. In the interim you  Abdominal Pain, Women Abdominal (stomach, pelvic, or belly) pain can be caused by many things. It is important to tell your doctor:  The location of the pain.  Does it come and go or is it present all the time?  Are there things that start the pain (eating certain foods, exercise)?  Are there other symptoms associated with the pain (fever, nausea, vomiting, diarrhea)? All of this is helpful to know when trying to find the cause of the pain. CAUSES   Stomach: virus or bacteria infection, or ulcer.  Intestine: appendicitis (inflamed appendix), regional ileitis (Crohn's disease), ulcerative colitis (inflamed colon), irritable bowel syndrome, diverticulitis (inflamed diverticulum of the colon), or cancer of the stomach or intestine.  Gallbladder disease or stones in the gallbladder.  Kidney disease, kidney stones, or infection.  Pancreas infection or cancer.  Fibromyalgia (pain disorder).  Diseases of the female organs:  Uterus: fibroid (non-cancerous) tumors or infection.  Fallopian tubes: infection or tubal pregnancy.  Ovary: cysts or tumors.  Pelvic adhesions (scar tissue).  Endometriosis (uterus lining tissue growing in the pelvis and on the pelvic organs).  Pelvic congestion syndrome (female organs filling up with blood just before the menstrual period).  Pain with the menstrual period.  Pain with ovulation (producing an egg).  Pain with an IUD (intrauterine device, birth control) in the uterus.  Cancer of the female organs.  Functional pain (pain not caused by a disease, may improve without treatment).  Psychological pain.  Depression. DIAGNOSIS  Your doctor will decide the seriousness of your pain by doing an examination.  Blood tests.  X-rays.  Ultrasound.  CT scan (computed tomography, special type of X-ray).  MRI (magnetic resonance imaging).  Cultures,  for infection.  Barium enema (dye inserted in the large intestine, to better view it with X-rays).  Colonoscopy (looking in intestine with a lighted tube).  Laparoscopy (minor surgery, looking in abdomen with a lighted tube).  Major abdominal exploratory surgery (looking in abdomen with a large incision). TREATMENT  The treatment will depend on the cause of the pain.   Many cases can be observed and treated at home.  Over-the-counter medicines recommended by your caregiver.  Prescription medicine.  Antibiotics, for infection.  Birth control pills, for painful periods or for ovulation pain.  Hormone treatment, for endometriosis.  Nerve blocking injections.  Physical therapy.  Antidepressants.  Counseling with a psychologist or psychiatrist.  Minor or major surgery. HOME CARE INSTRUCTIONS   Do not take laxatives, unless directed by your caregiver.  Take over-the-counter pain medicine only if ordered by your caregiver. Do not take aspirin because it can cause an upset stomach or bleeding.  Try a clear liquid diet (broth or water) as ordered by your caregiver. Slowly move to a bland diet, as tolerated, if the pain is related to the stomach or intestine.  Have a thermometer and take your temperature several times a day, and record it.  Bed rest and sleep, if it helps the pain.  Avoid sexual intercourse, if it causes pain.  Avoid stressful situations.  Keep your follow-up appointments and tests, as your caregiver orders.  If the pain does not go away with medicine or surgery, you may try:  Acupuncture.  Relaxation exercises (yoga, meditation).  Group therapy.  Counseling. SEEK MEDICAL CARE IF:   You notice certain foods cause stomach pain.  Your home care treatment is not helping  your pain.  You need stronger pain medicine.  You want your IUD removed.  You feel faint or lightheaded.  You develop nausea and vomiting.  You develop a rash.  You are  having side effects or an allergy to your medicine. SEEK IMMEDIATE MEDICAL CARE IF:   Your pain does not go away or gets worse.  You have a fever.  Your pain is felt only in portions of the abdomen. The right side could possibly be appendicitis. The left lower portion of the abdomen could be colitis or diverticulitis.  You are passing blood in your stools (bright red or black tarry stools, with or without vomiting).  You have blood in your urine.  You develop chills, with or without a fever.  You pass out. MAKE SURE YOU:   Understand these instructions.  Will watch your condition.  Will get help right away if you are not doing well or get worse. Document Released: 07/27/2007 Document Revised: 02/13/2014 Document Reviewed: 08/16/2009 [facility] Patient Information 2015 [place], [place]. This information is not intended to replace advice given to you by your health care provider. Make sure you discuss any questions you have with your health care provider.  will be placed on Keflex 500 mg. The remainder of your testing and  CT scan are non-acute. Please come to the radiology department at 2:50 PM tomorrow September 19 for an outpatient ultrasound. Please use Tylenol or ibuprofen for mild pain, lesions Ultram for severe pain. Ultram may cause drowsiness, please use this medication with caution.

## 2015-07-01 NOTE — ED Notes (Signed)
Patient with no complaints at this time. Respirations even and unlabored. Skin warm/dry. Discharge instructions reviewed with patient at this time. Patient given opportunity to voice concerns/ask questions. IV removed per policy and band-aid applied to site. Patient discharged at this time and left Emergency Department with steady gait.  

## 2015-07-01 NOTE — ED Notes (Signed)
C/o abdominal pain and upper gastric burning since yesterday.  Rates pain 10/10.

## 2015-07-01 NOTE — ED Provider Notes (Signed)
CSN: [id number]     Arrival date & time 07/01/15  0735 History   First MD Initiated Contact with Patient 07/01/15 0809     Chief Complaint  Patient presents with  . Abdominal Pain     (Consider location/radiation/quality/duration/timing/severity/associated sxs/prior Treatment) Patient is a 29 y.o. female presenting with abdominal pain. The history is provided by the patient.  Abdominal Pain Pain location:  Periumbilical, LUQ, LLQ and RLQ Pain quality: sharp   Pain radiates to:  Periumbilical region Pain severity:  Moderate Onset quality:  Gradual Duration:  1 day Timing:  Intermittent Progression:  Worsening Chronicity:  New Context: alcohol use   Context: not diet changes, not recent illness, not recent travel, not suspicious food intake and not trauma   Relieved by:  Nothing Worsened by:  Movement (walking) Ineffective treatments:  None tried Associated symptoms: dysuria and vaginal discharge   Associated symptoms: no chills, no diarrhea, no hematemesis, no hematochezia, no hematuria, no melena, no shortness of breath, no vaginal bleeding and no vomiting   Risk factors: has not had multiple surgeries     Past Medical History  Diagnosis Date  . Breast disorder    Past Surgical History  Procedure Laterality Date  . Cesarean section     Family History  Problem Relation Age of Onset  . Hypertension Mother    Social History  Substance Use Topics  . Smoking status: Current Every Day Smoker -- 0.50 packs/day    Types: Cigarettes  . Smokeless tobacco: None  . Alcohol Use: Yes     Comment: occ   OB History    No data available     Review of Systems  Constitutional: Negative for chills.  Respiratory: Negative for shortness of breath.   Gastrointestinal: Positive for abdominal pain. Negative for vomiting, diarrhea, melena, hematochezia and hematemesis.  Genitourinary: Positive for dysuria and vaginal discharge. Negative for hematuria and vaginal bleeding.  All other  systems reviewed and are negative.     Allergies  Coconut flavor  Home Medications   Prior to Admission medications   Medication Sig Start Date End Date Taking? Authorizing Provider  acetaminophen (TYLENOL) 500 MG tablet Take 1,000 mg by mouth every 6 (six) hours as needed for moderate pain.    Historical Provider, MD  amoxicillin (AMOXIL) 500 MG capsule Take 1 capsule (500 mg total) by mouth 2 (two) times daily. Patient not taking: Reported on 08/27/2014 05/27/14   Ivery Quale, PA-C  Chlorpheniramine-Acetaminophen (CORICIDIN HBP COLD/FLU PO) Take 2 tablets by mouth every 4 (four) hours as needed (COUGH/CONGESTION/COLD).    Historical Provider, MD  famotidine (PEPCID) 20 MG tablet Take 1 tablet (20 mg total) by mouth 2 (two) times daily. 03/25/15   Hope Orlene Och, NP  ibuprofen (ADVIL,MOTRIN) 800 MG tablet Take 800 mg by mouth as needed for mild pain.    Historical Provider, MD  loratadine (CLARITIN) 10 MG tablet Take 1 tablet (10 mg total) by mouth daily. 03/25/15   Hope Orlene Och, NP  megestrol (MEGACE) 40 MG tablet Take 1 tablet (40 mg total) by mouth daily. 10/30/14   Lazaro Arms, MD  predniSONE (DELTASONE) 10 MG tablet Take 2 tablets (20 mg total) by mouth 2 (two) times daily with a meal. 03/25/15   Hope Orlene Och, NP  ranitidine (ZANTAC) 150 MG capsule Take 1 capsule (150 mg total) by mouth daily. Patient not taking: Reported on 10/30/2014 08/27/14   Bethann Berkshire, MD   BP 126/68 mmHg  Pulse 100  Temp(Src) 99.8 F (37.7 C) (Oral)  Resp 18  Ht  (1.575 m)  Wt 121 lb (54.885 kg)  BMI 22.13 kg/m2  SpO2 100%  LMP 07/01/2015 Physical Exam  Constitutional: She is oriented to person, place, and time. She appears well-developed and well-nourished.  Non-toxic appearance.  HENT:  Head: Normocephalic.  Right Ear: Tympanic membrane and external ear normal.  Left Ear: Tympanic membrane and external ear normal.  Eyes: EOM and lids are normal. Pupils are equal, round, and reactive to  light.  Neck: Normal range of motion. Neck supple. Carotid bruit is not present.  Cardiovascular: Regular rhythm, normal heart sounds, intact distal pulses and normal pulses.  Tachycardia present.   Pulmonary/Chest: Breath sounds normal. No respiratory distress.  Abdominal: Soft. Bowel sounds are normal. She exhibits no distension, no fluid wave and no ascites. There is no hepatosplenomegaly. There is tenderness in the right lower quadrant, periumbilical area, left upper quadrant and left lower quadrant. There is CVA tenderness. There is no rigidity and no guarding.  Musculoskeletal: Normal range of motion.  Lymphadenopathy:       Head (right side): No submandibular adenopathy present.       Head (left side): No submandibular adenopathy present.    She has no cervical adenopathy.  Neurological: She is alert and oriented to person, place, and time. She has normal strength. No cranial nerve deficit or sensory deficit.  Skin: Skin is warm and dry.  Psychiatric: She has a normal mood and affect. Her speech is normal.  Nursing note and vitals reviewed.   ED Course  Procedures (including critical care time) Labs Review Labs Reviewed - No data to display  Imaging Review No results found. I have personally reviewed and evaluated these images and lab results as part of my medical decision-making.   EKG Interpretation None      MDM  Pt c/o abd pain of multiple areas. Temp of 99.8 Pulse 100. Pulse ox 100% on room air. Eval for  Esophagitis, PUD, UTI, appendicitis, ovarian cyst, severe menstrual cramp/pain.  UA abnormal. Culture sent to the lab. Rx for keflex given to the patient. CT scan negative for acute problem.Lipase low. Pregnancy test negative.  Plan for Transvag ultrasound to eval for ovarian cyst as source of pain. Rx for Ultram given to take with ibuprofen.  Pt to have ultrasound at 3:00 pm 9/19.    Final diagnoses:  None    **I have reviewed nursing notes, vital signs, and  all appropriate lab and imaging results for this patient.Ivery Quale, PA-C 07/01/15 1147  Donnetta Hutching, MD 07/01/15 [phone]

## 2015-07-02 ENCOUNTER — Ambulatory Visit (HOSPITAL_COMMUNITY)
Admit: 2015-07-02 | Discharge: 2015-07-02 | Disposition: A | Discharge status: Home / Self Care | Payer: Self-pay | Source: Ambulatory Visit | Attending: Physician Assistant | Admitting: Physician Assistant

## 2015-07-02 ENCOUNTER — Other Ambulatory Visit (HOSPITAL_COMMUNITY): Payer: Self-pay | Admitting: Physician Assistant

## 2015-07-02 DIAGNOSIS — R102 Pelvic and perineal pain: Secondary | ICD-10-CM | POA: Insufficient documentation

## 2015-07-02 LAB — URINE CULTURE: Culture: NO GROWTH

## 2015-07-02 LAB — GC/CHLAMYDIA PROBE AMP (~~LOC~~) NOT AT ARMC
Chlamydia: NEGATIVE
Neisseria Gonorrhea: POSITIVE — AB

## 2015-07-02 LAB — RPR: RPR Ser Ql: NONREACTIVE

## 2015-07-02 LAB — HIV ANTIBODY (ROUTINE TESTING W REFLEX): HIV Screen 4th Generation wRfx: NONREACTIVE

## 2015-07-03 ENCOUNTER — Telehealth (HOSPITAL_COMMUNITY): Payer: Self-pay

## 2015-07-03 NOTE — Telephone Encounter (Signed)
Positive for gonorrhea. Chart sent to EDP office for review.  

## 2015-07-08 ENCOUNTER — Emergency Department (HOSPITAL_COMMUNITY)
Admission: EM | Admit: 2015-07-08 | Discharge: 2015-07-08 | Disposition: A | Discharge status: Home / Self Care | Payer: Self-pay | Attending: Emergency Medicine | Admitting: Emergency Medicine

## 2015-07-08 ENCOUNTER — Encounter (HOSPITAL_COMMUNITY): Payer: Self-pay | Admitting: *Deleted

## 2015-07-08 ENCOUNTER — Telehealth (HOSPITAL_COMMUNITY): Payer: Self-pay

## 2015-07-08 DIAGNOSIS — Z720 Tobacco use: Secondary | ICD-10-CM | POA: Insufficient documentation

## 2015-07-08 DIAGNOSIS — Z8742 Personal history of other diseases of the female genital tract: Secondary | ICD-10-CM | POA: Insufficient documentation

## 2015-07-08 DIAGNOSIS — A549 Gonococcal infection, unspecified: Secondary | ICD-10-CM | POA: Insufficient documentation

## 2015-07-08 MED ORDER — LIDOCAINE HCL (PF) 1 % IJ SOLN
INTRAMUSCULAR | Status: AC
  Administered 2015-07-08: 5 mL
  Filled 2015-07-08: qty 5

## 2015-07-08 MED ORDER — AZITHROMYCIN 250 MG PO TABS
1000.0000 mg | ORAL_TABLET | Freq: Once | ORAL | Status: AC
  Administered 2015-07-08: 1000 mg via ORAL
  Filled 2015-07-08: qty 4

## 2015-07-08 MED ORDER — CEFTRIAXONE SODIUM 250 MG IJ SOLR
250.0000 mg | Freq: Once | INTRAMUSCULAR | Status: AC
  Administered 2015-07-08: 250 mg via INTRAMUSCULAR
  Filled 2015-07-08: qty 250

## 2015-07-08 NOTE — Discharge Instructions (Signed)
Gonorrhea °Gonorrhea is an infection that can cause serious problems. If left untreated, the infection may:  °· Damage the female or female organs.   °· Cause women to be unable to have children (sterility).   °· Harm a fetus if the infected woman is pregnant.   °It is important to get treatment for gonorrhea as soon as possible. It is also necessary that all your sexual partners be tested for the infection.  °CAUSES  °Gonorrhea is caused by bacteria called Neisseria gonorrhoeae. The infection is spread from person to person, usually by sexual contact (such as by anal, vaginal, or oral means). A newborn can contract the infection from his or her mother during birth.  °SYMPTOMS  °Some people with gonorrhea do not have symptoms. Symptoms may be different in females and males.  °Females  °The most common symptoms are:  °· Pain in the lower abdomen.   °· Fever with or without chills.   °Other symptoms include:  °· Abnormal vaginal discharge.   °· Painful intercourse.   °· Burning or itching of the vagina or lips of the vagina.   °· Abnormal vaginal bleeding.   °· Pain when urinating.   °· Long-lasting (chronic) pain in the lower abdomen, especially during menstruation or intercourse.   °· Inability to become pregnant.   °· Going into premature labor.   °· Irritation, pain, bleeding, or discharge from the rectum. This may occur if the infection was spread by anal sex.   °· Sore throat or swollen lymph nodes in the neck. This may occur if the infection was spread by oral sex.   °Males  °The most common symptoms are:  °· Discharge from the penis.   °· Pain or burning during urination.   °· Pain or swelling in the testicles. °Other symptoms may include:  °· Irritation, pain, bleeding, or discharge from the rectum. This may occur if the infection was spread by anal sex.   °· Sore throat, fever, or swollen lymph nodes in the neck. This may occur if the infection was spread by oral sex.   °DIAGNOSIS  °A diagnosis is made after a  physical exam is done and a sample of discharge is examined under a microscope for the presence of the bacteria. The discharge may be taken from the urethra, cervix, throat, or rectum.  °TREATMENT  °Gonorrhea is treated with antibiotic medicines. It is important for treatment to begin as soon as possible. Early treatment may prevent some problems from developing.  °HOME CARE INSTRUCTIONS  °· Take medicines only as directed by your health care provider.   °· Take your antibiotic medicine as directed by your health care provider. Finish the antibiotic even if you start to feel better. Incomplete treatment will put you at risk for continued infection.   °· Do not have sex until treatment is complete or as directed by your health care provider.   °· Keep all follow-up visits as directed by your health care provider.   °· Not all test results are available during your visit. If your test results are not back during the visit, make an appointment with your health care provider to find out the results. Do not assume everything is normal if you have not heard from your health care provider or the medical facility. It is your responsibility to get your test results. °· If you test positive for gonorrhea, inform your recent sexual partners. They need to be checked for gonorrhea even if they do not have symptoms. They may need treatment, even if they test negative for gonorrhea.   °SEEK MEDICAL CARE IF:  °· You develop any bad reaction   to the medicine you were prescribed. This may include:   A rash.   Nausea.   Vomiting.   Diarrhea.   Your symptoms do not improve after a few days of taking antibiotics.   Your symptoms get worse.   You develop increased pain, such as in the testicles (for males) or in the abdomen (for females).  You have a fever. MAKE SURE YOU:   Understand these instructions.  Will watch your condition.  Will get help right away if you are not doing well or get worse. Document  Released: 09/26/2000 Document Revised: 02/13/2014 Document Reviewed: 04/06/2013 [facility] Patient Information 2015 [place], [place]. This information is not intended to replace advice given to you by your health care provider. Make sure you discuss any questions you have with your health care provider. Safe Sex Safe sex is about reducing the risk of giving or getting a sexually transmitted disease (STD). STDs are spread through sexual contact involving the genitals, mouth, or rectum. Some STDs can be cured and others cannot. Safe sex can also prevent unintended pregnancies.  WHAT ARE SOME SAFE SEX PRACTICES?  Limit your sexual activity to only one partner who is having sex with only you.  Talk to your partner about his or her past partners, past STDs, and drug use.  Use a condom every time you have sexual intercourse. This includes vaginal, oral, and anal sexual activity. Both females and males should wear condoms during oral sex. Only use latex or polyurethane condoms and water-based lubricants. Using petroleum-based lubricants or oils to lubricate a condom will weaken the condom and increase the chance that it will break. The condom should be in place from the beginning to the end of sexual activity. Wearing a condom reduces, but does not completely eliminate, your risk of getting or giving an STD. STDs can be spread by contact with infected body fluids and skin.  Get vaccinated for hepatitis B and HPV.  Avoid alcohol and recreational drugs, which can affect your judgment. You may forget to use a condom or participate in high-risk sex.  For females, avoid douching after sexual intercourse. Douching can spread an infection farther into the reproductive tract.  Check your body for signs of sores, blisters, rashes, or unusual discharge. See your health care provider if you notice any of these signs.  Avoid sexual contact if you have symptoms of an infection or are being treated for an STD. If you or  your partner has herpes, avoid sexual contact when blisters are present. Use condoms at all other times.  If you are at risk of being infected with HIV, it is recommended that you take a prescription medicine daily to prevent HIV infection. This is called pre-exposure prophylaxis (PrEP). You are considered at risk if:  You are a man who has sex with other men (MSM).  You are a heterosexual man or woman who is sexually active with more than one partner.  You take drugs by injection.  You are sexually active with a partner who has HIV.  Talk with your health care provider about whether you are at high risk of being infected with HIV. If you choose to begin PrEP, you should first be tested for HIV. You should then be tested every 3 months for as long as you are taking PrEP.  See your health care provider for regular screenings, exams, and tests for other STDs. Before having sex with a new partner, each of you should be screened for STDs and  should talk about the results with each other. WHAT ARE THE BENEFITS OF SAFE SEX?   There is less chance of getting or giving an STD.  You can prevent unwanted or unintended pregnancies.  By discussing safe sex concerns with your partner, you may increase feelings of intimacy, comfort, trust, and honesty between the two of you. Document Released: 11/06/2004 Document Revised: 02/13/2014 Document Reviewed: 03/22/2012 [facility] Patient Information 2015 [place]. This information is not intended to replace advice given to you by your health care provider. Make sure you discuss any questions you have with your health care provider.

## 2015-07-08 NOTE — ED Notes (Signed)
Pt states she received a phone call stating she needs to come back to the ED to be treated for STI. Recent culture showed positive for gonorrhea.

## 2015-07-08 NOTE — ED Provider Notes (Signed)
History  This chart was scribed for non-physician practitioner, Langston Masker, PA-C,working with Glynn Octave, MD, by Karle Plumber, ED Scribe. This patient was seen in room APFT24/APFT24 and the patient's care was started at 12:32 PM.  No chief complaint on file.  The history is provided by the patient and medical records. No language interpreter was used.    HPI Comments:  Annette Armstrong is a 29 y.o. female who presents to the Emergency Department needing to be treated for gonorrhea. Pt states she was seen here 7 days ago for abdominal pain and after a negative exam and negative CT scan was referred to have a transvaginal ultrasound. Pt presented to have this scan done at [facility] but only had a transabdominal ultrasound done because she refused the transvaginal u/s. She states she was contacted today and informed she tested positive for gonorrhea. She denies any current abdominal pain. She denies fever, chills, nausea or vomiting.  Past Medical History  Diagnosis Date  . Breast disorder    Past Surgical History  Procedure Laterality Date  . Cesarean section     Family History  Problem Relation Age of Onset  . Hypertension Mother    Social History  Substance Use Topics  . Smoking status: Current Every Day Smoker -- 0.50 packs/day    Types: Cigarettes  . Smokeless tobacco: None  . Alcohol Use: Yes     Comment: occ   OB History    No data available     Review of Systems  Constitutional: Negative for fever and chills.  Gastrointestinal: Negative for nausea, vomiting and abdominal pain.  Genitourinary: Negative.   All other systems reviewed and are negative.   Allergies  Coconut flavor  Home Medications   Prior to Admission medications   Medication Sig Start Date End Date Taking? Authorizing Leondro Coryell  acetaminophen (TYLENOL) 500 MG tablet Take 1,000 mg by mouth every 6 (six) hours as needed for moderate pain.    Historical Aidaly Cordner, MD  amoxicillin  (AMOXIL) 500 MG capsule Take 1 capsule (500 mg total) by mouth 2 (two) times daily. Patient not taking: Reported on 08/27/2014 05/27/14   Ivery Quale, PA-C  cephALEXin (KEFLEX) 500 MG capsule Take 1 capsule (500 mg total) by mouth 4 (four) times daily. 07/01/15   Ivery Quale, PA-C  Chlorpheniramine-Acetaminophen (CORICIDIN HBP COLD/FLU PO) Take 2 tablets by mouth every 4 (four) hours as needed (COUGH/CONGESTION/COLD).    Historical Voncille Simm, MD  famotidine (PEPCID) 20 MG tablet Take 1 tablet (20 mg total) by mouth 2 (two) times daily. Patient not taking: Reported on 07/01/2015 03/25/15   Janne Napoleon, NP  ibuprofen (ADVIL,MOTRIN) 800 MG tablet Take 800 mg by mouth as needed for mild pain.    Historical Esmirna Ravan, MD  loratadine (CLARITIN) 10 MG tablet Take 1 tablet (10 mg total) by mouth daily. Patient not taking: Reported on 07/01/2015 03/25/15   Janne Napoleon, NP  megestrol (MEGACE) 40 MG tablet Take 1 tablet (40 mg total) by mouth daily. Patient not taking: Reported on 07/01/2015 10/30/14   Lazaro Arms, MD  predniSONE (DELTASONE) 10 MG tablet Take 2 tablets (20 mg total) by mouth 2 (two) times daily with a meal. Patient not taking: Reported on 07/01/2015 03/25/15   Janne Napoleon, NP  ranitidine (ZANTAC) 150 MG capsule Take 1 capsule (150 mg total) by mouth daily. Patient not taking: Reported on 10/30/2014 08/27/14   Bethann Berkshire, MD  traMADol (ULTRAM) 50 MG tablet 1 or 2 po  q6h prn pain. Take with food 07/01/15   Ivery Quale, PA-C   Triage Vitals: BP 128/50 mmHg  Pulse 81  Temp(Src) 98.3 F (36.8 C) (Oral)  Resp 16  Ht  (1.575 m)  Wt 120 lb (54.432 kg)  BMI 21.94 kg/m2  LMP 07/01/2015 Physical Exam  Constitutional: She is oriented to person, place, and time. She appears well-developed and well-nourished.  HENT:  Head: Normocephalic and atraumatic.  Eyes: EOM are normal.  Neck: Normal range of motion.  Cardiovascular: Normal rate.   Pulmonary/Chest: Effort normal.  Musculoskeletal:  Normal range of motion.  Neurological: She is alert and oriented to person, place, and time.  Skin: Skin is warm and dry.  Psychiatric: She has a normal mood and affect. Her behavior is normal.  Nursing note and vitals reviewed.   ED Course  Procedures (including critical care time) COORDINATION OF CARE: 12:38 PM- Will order Rocephin injection, Zithromax tablets for treatment. Advised pt to abstain from sex for the next several days and use condoms when she resumes sexual activity. Pt verbalizes understanding and agrees to plan.  Medications  cefTRIAXone (ROCEPHIN) injection 250 mg (250 mg Intramuscular Given 07/08/15 1234)  azithromycin (ZITHROMAX) tablet 1,000 mg (1,000 mg Oral Given 07/08/15 1234)  lidocaine (PF) (XYLOCAINE) 1 % injection (5 mLs  Given 07/08/15 1234)    MDM   Final diagnoses:  Gonorrhea     I personally performed the services in this documentation, which was scribed in my presence.  The recorded information has been reviewed and considered.   Barnet Pall.  AVS SAFE SEX  Elson Areas, PA-C 07/08/15 1604  Glynn Octave, MD 07/08/15 [phone]

## 2015-07-08 NOTE — Telephone Encounter (Signed)
Spoke with pt. Verified ID. Informed of labs. Informed that she needs to return to [facility] , or ED for treatment with Rocephin IM and Zithromax.  Pt informed to abstain from sexual activity x 10 days after receiving treatment  and to notify partner for testing and treatment.

## 2016-01-06 ENCOUNTER — Emergency Department (HOSPITAL_COMMUNITY)
Admission: EM | Admit: 2016-01-06 | Discharge: 2016-01-06 | Disposition: A | Discharge status: Home / Self Care | Payer: Self-pay | Attending: Emergency Medicine | Admitting: Emergency Medicine

## 2016-01-06 ENCOUNTER — Encounter (HOSPITAL_COMMUNITY): Payer: Self-pay

## 2016-01-06 DIAGNOSIS — F17210 Nicotine dependence, cigarettes, uncomplicated: Secondary | ICD-10-CM | POA: Insufficient documentation

## 2016-01-06 DIAGNOSIS — N390 Urinary tract infection, site not specified: Secondary | ICD-10-CM

## 2016-01-06 LAB — URINALYSIS, ROUTINE W REFLEX MICROSCOPIC
Bilirubin Urine: NEGATIVE
Glucose, UA: NEGATIVE mg/dL
Ketones, ur: NEGATIVE mg/dL
Nitrite: POSITIVE — AB
PH: 6.5 (ref 5.0–8.0)
PROTEIN: NEGATIVE mg/dL
SPECIFIC GRAVITY, URINE: 1.015 (ref 1.005–1.030)

## 2016-01-06 LAB — POC URINE PREG, ED: Preg Test, Ur: NEGATIVE

## 2016-01-06 LAB — URINE MICROSCOPIC-ADD ON

## 2016-01-06 MED ORDER — CEPHALEXIN 500 MG PO CAPS: 500.0000 mg | ORAL_CAPSULE | Freq: Four times a day (QID) | ORAL | Status: DC

## 2016-01-06 MED ORDER — CEPHALEXIN 500 MG PO CAPS
500.0000 mg | ORAL_CAPSULE | Freq: Once | ORAL | Status: AC
  Administered 2016-01-06: 500 mg via ORAL
  Filled 2016-01-06: qty 1

## 2016-01-06 MED ORDER — ACETAMINOPHEN 500 MG PO TABS
1000.0000 mg | ORAL_TABLET | Freq: Once | ORAL | Status: AC
  Administered 2016-01-06: 1000 mg via ORAL
  Filled 2016-01-06: qty 2

## 2016-01-06 NOTE — Discharge Instructions (Signed)

## 2016-01-06 NOTE — ED Notes (Signed)
Started feeling like I have an UTI two days ago. Increased fluids, used AZO with some relief but it did not go away.

## 2016-01-06 NOTE — ED Provider Notes (Addendum)
TIME SEEN: 6:05 AM  CHIEF COMPLAINT: Dysuria, urinary frequency and urgency, suprapubic pressure  HPI: Pt is a 30 y.o. female with no significant past medical history who presents to the emergency department with 2 days of dysuria, urinary frequency and urgency, suprapubic pressure with urination. No hematuria. Has had urinary tract infections in the past and states this felt similar. No abnormal vaginal bleeding or discharge. Last menstrual period was 9 days ago. No history of abdominal surgery. No fever, chills, nausea, vomiting or diarrhea. No flank pain. No history of kidney stones.  ROS: See HPI Constitutional: no fever  Eyes: no drainage  ENT: no runny nose   Cardiovascular:  no chest pain  Resp: no SOB  GI: no vomiting GU: dysuria Integumentary: no rash  Allergy: no hives  Musculoskeletal: no leg swelling  Neurological: no slurred speech ROS otherwise negative  PAST MEDICAL HISTORY/PAST SURGICAL HISTORY:  Past Medical History  Diagnosis Date  . Breast disorder     MEDICATIONS:  Prior to Admission medications   Medication Sig Start Date End Date Taking? Authorizing Provider  acetaminophen (TYLENOL) 500 MG tablet Take 1,000 mg by mouth every 6 (six) hours as needed for moderate pain.    Historical Provider, MD  amoxicillin (AMOXIL) 500 MG capsule Take 1 capsule (500 mg total) by mouth 2 (two) times daily. Patient not taking: Reported on 08/27/2014 05/27/14   Ivery Quale, PA-C  cephALEXin (KEFLEX) 500 MG capsule Take 1 capsule (500 mg total) by mouth 4 (four) times daily. 07/01/15   Ivery Quale, PA-C  Chlorpheniramine-Acetaminophen (CORICIDIN HBP COLD/FLU PO) Take 2 tablets by mouth every 4 (four) hours as needed (COUGH/CONGESTION/COLD).    Historical Provider, MD  famotidine (PEPCID) 20 MG tablet Take 1 tablet (20 mg total) by mouth 2 (two) times daily. Patient not taking: Reported on 07/01/2015 03/25/15   Janne Napoleon, NP  ibuprofen (ADVIL,MOTRIN) 800 MG tablet Take 800 mg  by mouth as needed for mild pain.    Historical Provider, MD  loratadine (CLARITIN) 10 MG tablet Take 1 tablet (10 mg total) by mouth daily. Patient not taking: Reported on 07/01/2015 03/25/15   Janne Napoleon, NP  megestrol (MEGACE) 40 MG tablet Take 1 tablet (40 mg total) by mouth daily. Patient not taking: Reported on 07/01/2015 10/30/14   Lazaro Arms, MD  predniSONE (DELTASONE) 10 MG tablet Take 2 tablets (20 mg total) by mouth 2 (two) times daily with a meal. Patient not taking: Reported on 07/01/2015 03/25/15   Janne Napoleon, NP  ranitidine (ZANTAC) 150 MG capsule Take 1 capsule (150 mg total) by mouth daily. Patient not taking: Reported on 10/30/2014 08/27/14   Bethann Berkshire, MD  traMADol (ULTRAM) 50 MG tablet 1 or 2 po q6h prn pain. Take with food 07/01/15   Ivery Quale, PA-C    ALLERGIES:  Allergies  Allergen Reactions  . Coconut Flavor Hives    SOCIAL HISTORY:  Social History  Substance Use Topics  . Smoking status: Current Every Day Smoker -- 0.50 packs/day    Types: Cigarettes  . Smokeless tobacco: Not on file  . Alcohol Use: Yes     Comment: occ    FAMILY HISTORY: Family History  Problem Relation Age of Onset  . Hypertension Mother     EXAM: BP 116/46 mmHg  Pulse 69  Temp(Src) 98.5 F (36.9 C) (Oral)  Resp 18  Ht  (1.575 m)  Wt 126 lb (57.153 kg)  BMI 23.04 kg/m2  SpO2 100%  LMP 12/30/2015 (Approximate) CONSTITUTIONAL: Alert and oriented and responds appropriately to questions. Well-appearing; well-nourished, afebrile and nontoxic appearing HEAD: Normocephalic EYES: Conjunctivae clear, PERRL ENT: normal nose; no rhinorrhea; moist mucous membranes NECK: Supple, no meningismus, no LAD  CARD: RRR; S1 and S2 appreciated; no murmurs, no clicks, no rubs, no gallops RESP: Normal chest excursion without splinting or tachypnea; breath sounds clear and equal bilaterally; no wheezes, no rhonchi, no rales, no hypoxia or respiratory distress, speaking full  sentences ABD/GI: Normal bowel sounds; non-distended; soft, non-tender, no rebound, no guarding, no peritoneal signs BACK:  The back appears normal and is non-tender to palpation, there is no CVA tenderness EXT: Normal ROM in all joints; non-tender to palpation; no edema; normal capillary refill; no cyanosis, no calf tenderness or swelling    SKIN: Normal color for age and race; warm; no rash NEURO: Moves all extremities equally, sensation to light touch intact diffusely, cranial nerves II through XII intact PSYCH: The patient's mood and manner are appropriate. Grooming and personal hygiene are appropriate.  MEDICAL DECISION MAKING: Patient here with symptoms of urinary tract infection. Urinalysis shows nitrite positive urinary tract infection. Previous urine cultures have grown Escherichia coli that was pansensitive except to ampicillin, enterococcus that was p.m. sensitive, Proteus that was pansensitive except to Macrobid. We'll discharge on Keflex 500 mg every 6 hours for the next 7 days. Culture is pending. Pregnancy test is negative. Abdominal exam is benign. Doubt appendicitis, pyelonephritis, bowel obstruction, colitis, diverticulitis. She has no vaginal symptoms, pelvic symptoms. Doubt PID, TOA.   At this time, I do not feel there is any life-threatening condition present. I have reviewed and discussed all results (EKG, imaging, lab, urine as appropriate), exam findings with patient. I have reviewed nursing notes and appropriate previous records.  I feel the patient is safe to be discharged home without further emergent workup. Discussed usual and customary return precautions. Patient and family (if present) verbalize understanding and are comfortable with this plan.  Patient will follow-up with their primary care provider. If they do not have a primary care provider, information for follow-up has been provided to them. All questions have been answered.      Layla MawKristen N Kairos Panetta, DO 01/06/16  [phone]  Layla MawKristen N Daeshaun Specht, DO 01/06/16 [phone]

## 2016-01-08 LAB — URINE CULTURE: Culture: >=100000

## 2016-01-09 ENCOUNTER — Telehealth (HOSPITAL_BASED_OUTPATIENT_CLINIC_OR_DEPARTMENT_OTHER): Payer: Self-pay

## 2016-01-09 NOTE — Telephone Encounter (Signed)
Post ED Visit - Positive Culture Follow-up  Culture report reviewed by antimicrobial stewardship pharmacist:  []  Enzo BiNathan Batchelder, Pharm.D. []  Celedonio MiyamotoJeremy Frens, Pharm.D., BCPS []  Garvin FilaMike Maccia, Pharm.D. []  Georgina PillionElizabeth Martin, Pharm.D., BCPS []  [place] IslandMinh Pham, [address] Rainbow BoulevardPharm.D., BCPS, AAHIVP []  Estella HuskMichelle Turner, Pharm.D., BCPS, AAHIVP []  Tennis Mustassie Stewart, Pharm.D. []  Sherle Poeob Vincent, [place]. Magna Decker Pharm D. Positive Urine culture Treated with Cephalexin, organism sensitive to the same and no further patient follow-up is required at this time.  Jerry CarasCullom, Selma Rodelo Burnett 01/09/2016, 10:52 AM

## 2016-03-25 ENCOUNTER — Emergency Department (HOSPITAL_COMMUNITY)
Admission: EM | Admit: 2016-03-25 | Discharge: 2016-03-25 | Disposition: A | Discharge status: Home / Self Care | Payer: Self-pay | Attending: Emergency Medicine | Admitting: Emergency Medicine

## 2016-03-25 ENCOUNTER — Encounter (HOSPITAL_COMMUNITY): Payer: Self-pay | Admitting: *Deleted

## 2016-03-25 DIAGNOSIS — F17210 Nicotine dependence, cigarettes, uncomplicated: Secondary | ICD-10-CM | POA: Insufficient documentation

## 2016-03-25 DIAGNOSIS — Z202 Contact with and (suspected) exposure to infections with a predominantly sexual mode of transmission: Secondary | ICD-10-CM

## 2016-03-25 DIAGNOSIS — R102 Pelvic and perineal pain: Secondary | ICD-10-CM | POA: Insufficient documentation

## 2016-03-25 LAB — WET PREP, GENITAL
Sperm: NONE SEEN
Trich, Wet Prep: NONE SEEN
YEAST WET PREP: NONE SEEN

## 2016-03-25 LAB — URINALYSIS, ROUTINE W REFLEX MICROSCOPIC
BILIRUBIN URINE: NEGATIVE
Glucose, UA: NEGATIVE mg/dL
Hgb urine dipstick: NEGATIVE
Ketones, ur: NEGATIVE mg/dL
Leukocytes, UA: NEGATIVE
NITRITE: NEGATIVE
PROTEIN: NEGATIVE mg/dL
SPECIFIC GRAVITY, URINE: 1.02 (ref 1.005–1.030)
pH: 6 (ref 5.0–8.0)

## 2016-03-25 LAB — POC URINE PREG, ED: PREG TEST UR: NEGATIVE

## 2016-03-25 MED ORDER — LIDOCAINE HCL (PF) 1 % IJ SOLN
INTRAMUSCULAR | Status: AC
  Filled 2016-03-25: qty 5

## 2016-03-25 MED ORDER — AZITHROMYCIN 1 G PO PACK
1.0000 g | PACK | Freq: Once | ORAL | Status: AC
  Administered 2016-03-25: 1 g via ORAL
  Filled 2016-03-25: qty 1

## 2016-03-25 MED ORDER — IBUPROFEN 800 MG PO TABS
800.0000 mg | ORAL_TABLET | Freq: Once | ORAL | Status: AC
  Administered 2016-03-25: 800 mg via ORAL
  Filled 2016-03-25: qty 1

## 2016-03-25 MED ORDER — CEFTRIAXONE SODIUM 250 MG IJ SOLR
250.0000 mg | Freq: Once | INTRAMUSCULAR | Status: AC
  Administered 2016-03-25: 250 mg via INTRAMUSCULAR
  Filled 2016-03-25: qty 250

## 2016-03-25 NOTE — ED Provider Notes (Signed)
CSN: [id number]     Arrival date & time 03/25/16  6962 History   First MD Initiated Contact with Patient 03/25/16 0450     Chief Complaint  Patient presents with  . SEXUALLY TRANSMITTED DISEASE     (Consider location/radiation/quality/duration/timing/severity/associated sxs/prior Treatment) The history is provided by the patient.  30 year old female with past history of gonorrhea comes in requesting an STD check. She had unprotected sex with someone about 2 weeks ago, and then found out that person had sexual relations with someone else who had an STD. She does not know what STD and may been. She denies any vaginal discharge but has had some suprapubic pain over the last 5 days. She rates pain at 5/10. Nothing makes it worse. This better with taking a hot bath. Also slightly better he takes ibuprofen. Also, of note, she has had some urinary urgency and frequency without dysuria. She denies fever or chills.  Past Medical History  Diagnosis Date  . Breast disorder    Past Surgical History  Procedure Laterality Date  . Cesarean section     Family History  Problem Relation Age of Onset  . Hypertension Mother    Social History  Substance Use Topics  . Smoking status: Current Every Day Smoker -- 0.50 packs/day    Types: Cigarettes  . Smokeless tobacco: None  . Alcohol Use: Yes     Comment: occ   OB History    No data available     Review of Systems  All other systems reviewed and are negative.     Allergies  Coconut flavor  Home Medications   Prior to Admission medications   Not on File   BP 109/59 mmHg  Pulse 57  Temp(Src) 97.9 F (36.6 C) (Oral)  Resp 16  Ht  (1.6 m)  Wt 125 lb (56.7 kg)  BMI 22.15 kg/m2  SpO2 99%  LMP 02/25/2016 Physical Exam  Nursing note and vitals reviewed.  29 year old female, resting comfortably and in no acute distress. Vital signs are significant for mild bradycardia. Oxygen saturation is 99%, which is normal. Head is  normocephalic and atraumatic. PERRLA, EOMI. Oropharynx is clear. Neck is nontender and supple without adenopathy or JVD. Back is nontender and there is no CVA tenderness. Lungs are clear without rales, wheezes, or rhonchi. Chest is nontender. Heart has regular rate and rhythm without murmur. Abdomen is soft, flat, nontender without masses or hepatosplenomegaly and peristalsis is normoactive. Pelvic: Normal external female genitalia. Cervix is closed and without evidence of inflammation. No significant discharge present. On bimanual exam, fundus is anteverted and anteflexed. No cervical motion tenderness. There is mild bilateral adnexal tenderness without adnexal masses. Extremities have no cyanosis or edema, full range of motion is present. Skin is warm and dry without rash. Neurologic: Mental status is normal, cranial nerves are intact, there are no motor or sensory deficits.  ED Course  Procedures (including critical care time) Labs Review Results for orders placed or performed during the hospital encounter of 03/25/16  Wet prep, genital  Result Value Ref Range   Yeast Wet Prep HPF POC NONE SEEN NONE SEEN   Trich, Wet Prep NONE SEEN NONE SEEN   Clue Cells Wet Prep HPF POC PRESENT (A) NONE SEEN   WBC, Wet Prep HPF POC FEW (A) NONE SEEN   Sperm NONE SEEN   Urinalysis, Routine w reflex microscopic  Result Value Ref Range   Color, Urine YELLOW YELLOW   APPearance CLEAR CLEAR  Specific Gravity, Urine 1.020 1.005 - 1.030   pH 6.0 5.0 - 8.0   Glucose, UA NEGATIVE NEGATIVE mg/dL   Hgb urine dipstick NEGATIVE NEGATIVE   Bilirubin Urine NEGATIVE NEGATIVE   Ketones, ur NEGATIVE NEGATIVE mg/dL   Protein, ur NEGATIVE NEGATIVE mg/dL   Nitrite NEGATIVE NEGATIVE   Leukocytes, UA NEGATIVE NEGATIVE  POC urine preg, ED  Result Value Ref Range   Preg Test, Ur NEGATIVE NEGATIVE   I have personally reviewed and evaluated these images and lab results as part of my medical  decision-making.   MDM   Final diagnoses:  Possible exposure to STD  Pelvic pain in female    Possible STD exposure. Specimens are sent for GC and chlamydia testing, blood sent for RPR and HIV testing. Urinalysis obtained for evaluation of possible UTI. Old records are reviewed confirming prior episode of gonorrhea, And also an episode of chlamydia.She is given ceftriaxone and azithromycin to treat for STDs. Clinically, no evidence of PID so no indication for additional treatment tonight.Wet prep shows clue cells, but she does not have clinical bacterial vaginosis, so no treatment is initiated for that. She is advised to have all sexual partners treated.    Dione Boozeavid Greene Diodato, MD 03/25/16 [phone]

## 2016-03-25 NOTE — Discharge Instructions (Signed)
Sexually Transmitted Disease  A sexually transmitted disease (STD) is a disease or infection that may be passed (transmitted) from person to person, usually during sexual activity. This may happen by way of saliva, semen, blood, vaginal mucus, or urine. Common STDs include:  · Gonorrhea.  · Chlamydia.  · Syphilis.  · HIV and AIDS.  · Genital herpes.  · Hepatitis B and C.  · Trichomonas.  · Human papillomavirus (HPV).  · Pubic lice.  · Scabies.  · Mites.  · Bacterial vaginosis.  WHAT ARE CAUSES OF STDs?  An STD may be caused by bacteria, a virus, or parasites. STDs are often transmitted during sexual activity if one person is infected. However, they may also be transmitted through nonsexual means. STDs may be transmitted after:   · Sexual intercourse with an infected person.  · Sharing sex toys with an infected person.  · Sharing needles with an infected person or using unclean piercing or tattoo needles.  · Having intimate contact with the genitals, mouth, or rectal areas of an infected person.  · Exposure to infected fluids during birth.  WHAT ARE THE SIGNS AND SYMPTOMS OF STDs?  Different STDs have different symptoms. Some people may not have any symptoms. If symptoms are present, they may include:  · Painful or bloody urination.  · Pain in the pelvis, abdomen, vagina, anus, throat, or eyes.  · A skin rash, itching, or irritation.  · Growths, ulcerations, blisters, or sores in the genital and anal areas.  · Abnormal vaginal discharge with or without bad odor.  · Penile discharge in men.  · Fever.  · Pain or bleeding during sexual intercourse.  · Swollen glands in the groin area.  · Yellow skin and eyes (jaundice). This is seen with hepatitis.  · Swollen testicles.  · Infertility.  · Sores and blisters in the mouth.  HOW ARE STDs DIAGNOSED?  To make a diagnosis, your health care provider may:  · Take a medical history.  · Perform a physical exam.  · Take a sample of any discharge to examine.  · Swab the throat,  cervix, opening to the penis, rectum, or vagina for testing.  · Test a sample of your first morning urine.  · Perform blood tests.  · Perform a Pap test, if this applies.  · Perform a colposcopy.  · Perform a laparoscopy.  HOW ARE STDs TREATED?  Treatment depends on the STD. Some STDs may be treated but not cured.  · Chlamydia, gonorrhea, trichomonas, and syphilis can be cured with antibiotic medicine.  · Genital herpes, hepatitis, and HIV can be treated, but not cured, with prescribed medicines. The medicines lessen symptoms.  · Genital warts from HPV can be treated with medicine or by freezing, burning (electrocautery), or surgery. Warts may come back.  · HPV cannot be cured with medicine or surgery. However, abnormal areas may be removed from the cervix, vagina, or vulva.  · If your diagnosis is confirmed, your recent sexual partners need treatment. This is true even if they are symptom-free or have a negative culture or evaluation. They should not have sex until their health care providers say it is okay.  · Your health care provider may test you for infection again 3 months after treatment.  HOW CAN I REDUCE MY RISK OF GETTING AN STD?  Take these steps to reduce your risk of getting an STD:  · Use latex condoms, dental dams, and water-soluble lubricants during sexual activity. Do not use   petroleum jelly or oils.  · Avoid having multiple sex partners.  · Do not have sex with someone who has other sex partners  · Do not have sex with anyone you do not know or who is at high risk for an STD.  · Avoid risky sex practices that can break your skin.  · Do not have sex if you have open sores on your mouth or skin.  · Avoid drinking too much alcohol or taking illegal drugs. Alcohol and drugs can affect your judgment and put you in a vulnerable position.  · Avoid engaging in oral and anal sex acts.  · Get vaccinated for HPV and hepatitis. If you have not received these vaccines in the past, talk to your health care  provider about whether one or both might be right for you.  · If you are at risk of being infected with HIV, it is recommended that you take a prescription medicine daily to prevent HIV infection. This is called pre-exposure prophylaxis (PrEP). You are considered at risk if:    You are a man who has sex with other men (MSM).    You are a heterosexual man or woman and are sexually active with more than one partner.    You take drugs by injection.    You are sexually active with a partner who has HIV.  · Talk with your health care provider about whether you are at high risk of being infected with HIV. If you choose to begin PrEP, you should first be tested for HIV. You should then be tested every 3 months for as long as you are taking PrEP.  WHAT SHOULD I DO IF I THINK I HAVE AN STD?  · See your health care provider.  · Tell your sexual partner(s). They should be tested and treated for any STDs.  · Do not have sex until your health care provider says it is okay.  WHEN SHOULD I GET IMMEDIATE MEDICAL CARE?  Contact your health care provider right away if:   · You have severe abdominal pain.  · You are a man and notice swelling or pain in your testicles.  · You are a woman and notice swelling or pain in your vagina.     This information is not intended to replace advice given to you by your health care provider. Make sure you discuss any questions you have with your health care provider.     Document Released: 12/20/2002 Document Revised: 10/20/2014 Document Reviewed: 04/19/2013  Elsevier Interactive Patient Education ©2016 [employer]    Safe Sex  Safe sex is about reducing the risk of giving or getting a sexually transmitted disease (STD). STDs are spread through sexual contact involving the genitals, mouth, or rectum. Some STDs can be cured and others cannot. Safe sex can also prevent unintended pregnancies.   WHAT ARE SOME SAFE SEX PRACTICES?  · Limit your sexual activity to only one partner who is having sex with  only you.  · Talk to your partner about his or her past partners, past STDs, and drug use.  · Use a condom every time you have sexual intercourse. This includes vaginal, oral, and anal sexual activity. Both females and males should wear condoms during oral sex. Only use latex or polyurethane condoms and water-based lubricants. Using petroleum-based lubricants or oils to lubricate a condom will weaken the condom and increase the chance that it will break. The condom should be in place from the beginning to   the end of sexual activity. Wearing a condom reduces, but does not completely eliminate, your risk of getting or giving an STD. STDs can be spread by contact with infected body fluids and skin.  · Get vaccinated for hepatitis B and HPV.  · Avoid alcohol and recreational drugs, which can affect your judgment. You may forget to use a condom or participate in high-risk sex.  · For females, avoid douching after sexual intercourse. Douching can spread an infection farther into the reproductive tract.  · Check your body for signs of sores, blisters, rashes, or unusual discharge. See your health care provider if you notice any of these signs.  · Avoid sexual contact if you have symptoms of an infection or are being treated for an STD. If you or your partner has herpes, avoid sexual contact when blisters are present. Use condoms at all other times.  · If you are at risk of being infected with HIV, it is recommended that you take a prescription medicine daily to prevent HIV infection. This is called pre-exposure prophylaxis (PrEP). You are considered at risk if:    You are a man who has sex with other men (MSM).    You are a heterosexual man or woman who is sexually active with more than one partner.    You take drugs by injection.    You are sexually active with a partner who has HIV.  · Talk with your health care provider about whether you are at high risk of being infected with HIV. If you choose to begin PrEP, you  should first be tested for HIV. You should then be tested every 3 months for as long as you are taking PrEP.  · See your health care provider for regular screenings, exams, and tests for other STDs. Before having sex with a new partner, each of you should be screened for STDs and should talk about the results with each other.  WHAT ARE THE BENEFITS OF SAFE SEX?   · There is less chance of getting or giving an STD.  · You can prevent unwanted or unintended pregnancies.  · By discussing safe sex concerns with your partner, you may increase feelings of intimacy, comfort, trust, and honesty between the two of you.     This information is not intended to replace advice given to you by your health care provider. Make sure you discuss any questions you have with your health care provider.     Document Released: 11/06/2004 Document Revised: 10/20/2014 Document Reviewed: 03/22/2012  Elsevier Interactive Patient Education ©2016 [employer] Inc[employer]
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
## 2016-03-26 LAB — RPR: RPR Ser Ql: NONREACTIVE

## 2016-03-26 LAB — GC/CHLAMYDIA PROBE AMP (~~LOC~~) NOT AT ARMC
CHLAMYDIA, DNA PROBE: NEGATIVE
Neisseria Gonorrhea: NEGATIVE

## 2016-03-26 LAB — HIV ANTIBODY (ROUTINE TESTING W REFLEX): HIV SCREEN 4TH GENERATION: NONREACTIVE

## 2016-04-12 ENCOUNTER — Emergency Department (HOSPITAL_COMMUNITY)
Admission: EM | Admit: 2016-04-12 | Discharge: 2016-04-12 | Disposition: A | Discharge status: Home / Self Care | Payer: No Typology Code available for payment source | Attending: Emergency Medicine | Admitting: Emergency Medicine

## 2016-04-12 ENCOUNTER — Encounter (HOSPITAL_COMMUNITY): Payer: Self-pay | Admitting: Emergency Medicine

## 2016-04-12 DIAGNOSIS — F17210 Nicotine dependence, cigarettes, uncomplicated: Secondary | ICD-10-CM | POA: Insufficient documentation

## 2016-04-12 DIAGNOSIS — S39012A Strain of muscle, fascia and tendon of lower back, initial encounter: Secondary | ICD-10-CM | POA: Diagnosis not present

## 2016-04-12 DIAGNOSIS — Y939 Activity, unspecified: Secondary | ICD-10-CM | POA: Diagnosis not present

## 2016-04-12 DIAGNOSIS — Y92410 Unspecified street and highway as the place of occurrence of the external cause: Secondary | ICD-10-CM | POA: Diagnosis not present

## 2016-04-12 DIAGNOSIS — S3992XA Unspecified injury of lower back, initial encounter: Secondary | ICD-10-CM | POA: Diagnosis present

## 2016-04-12 DIAGNOSIS — Y999 Unspecified external cause status: Secondary | ICD-10-CM | POA: Insufficient documentation

## 2016-04-12 MED ORDER — DICLOFENAC SODIUM 75 MG PO TBEC: 75.0000 mg | DELAYED_RELEASE_TABLET | Freq: Two times a day (BID) | ORAL | Status: DC

## 2016-04-12 MED ORDER — BACLOFEN 10 MG PO TABS: 10.0000 mg | ORAL_TABLET | Freq: Three times a day (TID) | ORAL | Status: DC
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
## 2016-04-21 ENCOUNTER — Encounter (HOSPITAL_COMMUNITY): Payer: Self-pay | Admitting: Emergency Medicine

## 2016-04-21 ENCOUNTER — Emergency Department (HOSPITAL_COMMUNITY)
Admission: EM | Admit: 2016-04-21 | Discharge: 2016-04-21 | Disposition: A | Discharge status: Home / Self Care | Payer: No Typology Code available for payment source | Attending: Emergency Medicine | Admitting: Emergency Medicine

## 2016-04-21 ENCOUNTER — Emergency Department (HOSPITAL_COMMUNITY): Payer: No Typology Code available for payment source

## 2016-04-21 DIAGNOSIS — Z79899 Other long term (current) drug therapy: Secondary | ICD-10-CM | POA: Diagnosis not present

## 2016-04-21 DIAGNOSIS — S199XXA Unspecified injury of neck, initial encounter: Secondary | ICD-10-CM | POA: Diagnosis present

## 2016-04-21 DIAGNOSIS — Y92410 Unspecified street and highway as the place of occurrence of the external cause: Secondary | ICD-10-CM | POA: Insufficient documentation

## 2016-04-21 DIAGNOSIS — F17210 Nicotine dependence, cigarettes, uncomplicated: Secondary | ICD-10-CM | POA: Insufficient documentation

## 2016-04-21 DIAGNOSIS — Y999 Unspecified external cause status: Secondary | ICD-10-CM | POA: Insufficient documentation

## 2016-04-21 DIAGNOSIS — S161XXA Strain of muscle, fascia and tendon at neck level, initial encounter: Secondary | ICD-10-CM | POA: Diagnosis not present

## 2016-04-21 DIAGNOSIS — Y939 Activity, unspecified: Secondary | ICD-10-CM | POA: Diagnosis not present

## 2016-04-21 MED ORDER — MELOXICAM 15 MG PO TABS: 15.0000 mg | ORAL_TABLET | Freq: Every day | ORAL | Status: DC

## 2016-04-21 MED ORDER — CYCLOBENZAPRINE HCL 10 MG PO TABS: 10.0000 mg | ORAL_TABLET | Freq: Three times a day (TID) | ORAL | Status: DC

## 2016-04-21 NOTE — Discharge Instructions (Signed)

## 2016-04-21 NOTE — ED Notes (Signed)
Pt states she was in [facility] a week ago and now her neck is hurting.  States it began yesterday.
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
## 2016-09-03 ENCOUNTER — Encounter (HOSPITAL_COMMUNITY): Payer: Self-pay | Admitting: Emergency Medicine

## 2016-09-03 ENCOUNTER — Emergency Department (HOSPITAL_COMMUNITY)
Admission: EM | Admit: 2016-09-03 | Discharge: 2016-09-03 | Disposition: A | Discharge status: Home / Self Care | Payer: Self-pay | Attending: Emergency Medicine | Admitting: Emergency Medicine

## 2016-09-03 DIAGNOSIS — F17210 Nicotine dependence, cigarettes, uncomplicated: Secondary | ICD-10-CM | POA: Insufficient documentation

## 2016-09-03 DIAGNOSIS — R1030 Lower abdominal pain, unspecified: Secondary | ICD-10-CM

## 2016-09-03 LAB — COMPREHENSIVE METABOLIC PANEL
ALT: 18 U/L (ref 14–54)
AST: 19 U/L (ref 15–41)
Albumin: 4.2 g/dL (ref 3.5–5.0)
Alkaline Phosphatase: 67 U/L (ref 38–126)
Anion gap: 7 (ref 5–15)
BUN: 10 mg/dL (ref 6–20)
CO2: 27 mmol/L (ref 22–32)
Calcium: 9.4 mg/dL (ref 8.9–10.3)
Chloride: 102 mmol/L (ref 101–111)
Creatinine, Ser: 0.71 mg/dL (ref 0.44–1.00)
GFR calc Af Amer: >60 mL/min (ref >60)
GFR calc non Af Amer: >60 mL/min (ref >60)
Glucose, Bld: 94 mg/dL (ref 65–99)
Potassium: 4.2 mmol/L (ref 3.5–5.1)
Sodium: 136 mmol/L (ref 135–145)
Total Bilirubin: 0.2 mg/dL — ABNORMAL LOW (ref 0.3–1.2)
Total Protein: 7.6 g/dL (ref 6.5–8.1)

## 2016-09-03 LAB — CBC
HCT: 41.5 % (ref 36.0–46.0)
Hemoglobin: 14 g/dL (ref 12.0–15.0)
MCH: 28.6 pg (ref 26.0–34.0)
MCHC: 33.7 g/dL (ref 30.0–36.0)
MCV: 84.7 fL (ref 78.0–100.0)
Platelets: 237 10*3/uL (ref 150–400)
RBC: 4.9 MIL/uL (ref 3.87–5.11)
RDW: 14 % (ref 11.5–15.5)
WBC: 6.4 10*3/uL (ref 4.0–10.5)

## 2016-09-03 LAB — WET PREP, GENITAL
Clue Cells Wet Prep HPF POC: NONE SEEN
Sperm: NONE SEEN
Trich, Wet Prep: NONE SEEN
Yeast Wet Prep HPF POC: NONE SEEN

## 2016-09-03 LAB — URINALYSIS, ROUTINE W REFLEX MICROSCOPIC
Bilirubin Urine: NEGATIVE
Glucose, UA: NEGATIVE mg/dL
Hgb urine dipstick: NEGATIVE
Ketones, ur: NEGATIVE mg/dL
Leukocytes, UA: NEGATIVE
Nitrite: NEGATIVE
Protein, ur: NEGATIVE mg/dL
Specific Gravity, Urine: 1.02 (ref 1.005–1.030)
pH: 7 (ref 5.0–8.0)

## 2016-09-03 LAB — POC URINE PREG, ED: Preg Test, Ur: NEGATIVE

## 2016-09-03 LAB — LIPASE, BLOOD: Lipase: 22 U/L (ref 11–51)

## 2016-09-03 MED ORDER — IBUPROFEN 400 MG PO TABS
600.0000 mg | ORAL_TABLET | Freq: Once | ORAL | Status: AC
  Administered 2016-09-03: 600 mg via ORAL
  Filled 2016-09-03: qty 2

## 2016-09-03 MED ORDER — CEFTRIAXONE SODIUM 250 MG IJ SOLR
250.0000 mg | Freq: Once | INTRAMUSCULAR | Status: AC
  Administered 2016-09-03: 250 mg via INTRAMUSCULAR
  Filled 2016-09-03: qty 250

## 2016-09-03 MED ORDER — LIDOCAINE HCL (PF) 1 % IJ SOLN
INTRAMUSCULAR | Status: AC
  Administered 2016-09-03: 0.9 mL
  Filled 2016-09-03: qty 5

## 2016-09-03 MED ORDER — AZITHROMYCIN 250 MG PO TABS
1000.0000 mg | ORAL_TABLET | Freq: Once | ORAL | Status: AC
  Administered 2016-09-03: 1000 mg via ORAL
  Filled 2016-09-03: qty 4

## 2016-09-03 NOTE — ED Triage Notes (Signed)
Pt reports abdominal pain that started 2 days ago. No emesis or diarrhea. Cough started 3 weeks ago.  Non productive cough. Pt denies fever.

## 2016-09-05 LAB — GC/CHLAMYDIA PROBE AMP (~~LOC~~) NOT AT ARMC
Chlamydia: NEGATIVE
Neisseria Gonorrhea: NEGATIVE

## 2016-09-12 NOTE — ED Provider Notes (Signed)
[facility]-EMERGENCY DEPT Provider Note   CSN: [id number] Arrival date & time: 09/03/16  1653     History   Chief Complaint Chief Complaint  Patient presents with  . Abdominal Pain    HPI Annette Armstrong is a 30 y.o. female.  HPI   30 year old female with abdominal pain. Gradual onset about 2 days ago. Pain is been slowly progressive. Lower abdomen. Waxes and wanes. No appreciable exacerbating relieving factors. Some vaginal discharge. No dysuria. No fevers or chills.  Past Medical History:  Diagnosis Date  . Breast disorder     There are no active problems to display for this patient.   Past Surgical History:  Procedure Laterality Date  . CESAREAN SECTION      OB History    Gravida Para Term Preterm AB Living   1 1 1          SAB TAB Ectopic Multiple Live Births                   Home Medications    Prior to Admission medications   Medication Sig Start Date End Date Taking? Authorizing Provider  Multiple Vitamins-Minerals (HAIR SKIN AND NAILS FORMULA PO) Take 1 tablet by mouth 3 (three) times daily.   Yes Historical Provider, MD  Multiple Vitamins-Minerals (MULTIVITAMIN ADULT PO) Take 1 tablet by mouth daily.   Yes Historical Provider, MD  baclofen (LIORESAL) 10 MG tablet Take 1 tablet (10 mg total) by mouth 3 (three) times daily. 04/12/16 05/12/16  Ivery QualeHobson Bryant, PA-C  cyclobenzaprine (FLEXERIL) 10 MG tablet Take 1 tablet (10 mg total) by mouth 3 (three) times daily. 04/21/16   Ivery QualeHobson Bryant, PA-C  diclofenac (VOLTAREN) 75 MG EC tablet Take 1 tablet (75 mg total) by mouth 2 (two) times daily. 04/12/16   Ivery QualeHobson Bryant, PA-C  meloxicam (MOBIC) 15 MG tablet Take 1 tablet (15 mg total) by mouth daily. 04/21/16   Ivery QualeHobson Bryant, PA-C    Family History Family History  Problem Relation Age of Onset  . Hypertension Mother     Social History Social History  Substance Use Topics  . Smoking status: Current Every Day Smoker    Packs/day: 0.50    Types: Cigarettes  . Smokeless  tobacco: Never Used  . Alcohol use Yes     Comment: occ     Allergies   Coconut flavor   Review of Systems Review of Systems  All systems reviewed and negative, other than as noted in HPI.   Physical Exam Updated Vital Signs BP (!) 122/52   Pulse 83   Temp 98.2 F (36.8 C) (Oral)   Resp 18   Ht 5\' 2"  (1.575 m)   Wt 118 lb (53.5 kg)   LMP 07/22/2016   SpO2 100%   BMI 21.58 kg/m   Physical Exam  Constitutional: She appears well-developed and well-nourished. No distress.  HENT:  Head: Normocephalic and atraumatic.  Eyes: Conjunctivae are normal. Right eye exhibits no discharge. Left eye exhibits no discharge.  Neck: Neck supple.  Cardiovascular: Normal rate, regular rhythm and normal heart sounds.  Exam reveals no gallop and no friction rub.   No murmur heard. Pulmonary/Chest: Effort normal and breath sounds normal. No respiratory distress.  Abdominal: Soft. She exhibits no distension. There is no tenderness.  Genitourinary:  Genitourinary Comments: Chaperone present. Normal external female genitalia. No concerning lesions noted. Moderate white vaginal discharge. No CMT or; Mass or tenderness.  Musculoskeletal: She exhibits no edema or tenderness.  Neurological: She is  alert.  Skin: Skin is warm and dry.  Psychiatric: She has a normal mood and affect. Her behavior is normal. Thought content normal.  Nursing note and vitals reviewed.    ED Treatments / Results  Labs (all labs ordered are listed, but only abnormal results are displayed) Labs Reviewed  WET PREP, GENITAL - Abnormal; Notable for the following:       Result Value   WBC, Wet Prep HPF POC FEW (*)    All other components within normal limits  COMPREHENSIVE METABOLIC PANEL - Abnormal; Notable for the following:    Total Bilirubin 0.2 (*)    All other components within normal limits  LIPASE, BLOOD  CBC  URINALYSIS, ROUTINE W REFLEX MICROSCOPIC ([facility])  POC URINE PREG, ED  GC/CHLAMYDIA PROBE  AMP ([place]) NOT AT [facility]    EKG  EKG Interpretation None       Radiology No results found.  Procedures Procedures (including critical care time)  Medications Ordered in ED Medications  ibuprofen (ADVIL,MOTRIN) tablet 600 mg (600 mg Oral Given 09/03/16 1934)  cefTRIAXone (ROCEPHIN) injection 250 mg (250 mg Intramuscular Given 09/03/16 2047)  azithromycin (ZITHROMAX) tablet 1,000 mg (1,000 mg Oral Given 09/03/16 2046)  lidocaine (PF) (XYLOCAINE) 1 % injection (0.9 mLs  Given 09/03/16 2047)     Initial Impression / Assessment and Plan / ED Course  I have reviewed the triage vital signs and the nursing notes.  Pertinent labs & imaging results that were available during my care of the patient were reviewed by me and considered in my medical decision making (see chart for details).  Clinical Course      Final Clinical Impressions(s) / ED Diagnoses   Final diagnoses:  Lower abdominal pain    New Prescriptions Discharge Medication List as of 09/03/2016  8:47 PM       Raeford RazorStephen Sadik Piascik, MD 09/12/16 [phone]

## 2017-07-26 ENCOUNTER — Encounter (HOSPITAL_COMMUNITY): Payer: Self-pay | Admitting: *Deleted

## 2017-07-26 ENCOUNTER — Emergency Department (HOSPITAL_COMMUNITY)
Admission: EM | Admit: 2017-07-26 | Discharge: 2017-07-26 | Disposition: A | Discharge status: Home / Self Care | Payer: Self-pay | Attending: Emergency Medicine | Admitting: Emergency Medicine

## 2017-07-26 DIAGNOSIS — R300 Dysuria: Secondary | ICD-10-CM | POA: Insufficient documentation

## 2017-07-26 DIAGNOSIS — R3915 Urgency of urination: Secondary | ICD-10-CM | POA: Insufficient documentation

## 2017-07-26 DIAGNOSIS — N760 Acute vaginitis: Secondary | ICD-10-CM | POA: Insufficient documentation

## 2017-07-26 DIAGNOSIS — F17210 Nicotine dependence, cigarettes, uncomplicated: Secondary | ICD-10-CM | POA: Insufficient documentation

## 2017-07-26 DIAGNOSIS — R350 Frequency of micturition: Secondary | ICD-10-CM

## 2017-07-26 DIAGNOSIS — B9689 Other specified bacterial agents as the cause of diseases classified elsewhere: Secondary | ICD-10-CM

## 2017-07-26 DIAGNOSIS — Z79899 Other long term (current) drug therapy: Secondary | ICD-10-CM | POA: Insufficient documentation

## 2017-07-26 LAB — WET PREP, GENITAL
Sperm: NONE SEEN
Trich, Wet Prep: NONE SEEN
Yeast Wet Prep HPF POC: NONE SEEN

## 2017-07-26 LAB — URINALYSIS, ROUTINE W REFLEX MICROSCOPIC
BILIRUBIN URINE: NEGATIVE
GLUCOSE, UA: NEGATIVE mg/dL
HGB URINE DIPSTICK: NEGATIVE
KETONES UR: NEGATIVE mg/dL
Leukocytes, UA: NEGATIVE
Nitrite: NEGATIVE
PROTEIN: NEGATIVE mg/dL
Specific Gravity, Urine: 1.011 (ref 1.005–1.030)
pH: 6 (ref 5.0–8.0)

## 2017-07-26 LAB — POC URINE PREG, ED: Preg Test, Ur: NEGATIVE

## 2017-07-26 MED ORDER — BORIC ACID CRYS: 1.0000 | CRYSTALS | Freq: Two times a day (BID) | 0 refills | Status: DC

## 2017-07-26 MED ORDER — METRONIDAZOLE 0.75 % VA GEL: 1.0000 | Freq: Two times a day (BID) | VAGINAL | 0 refills | Status: AC

## 2017-07-26 MED ORDER — METRONIDAZOLE 500 MG PO TABS: 500.0000 mg | ORAL_TABLET | Freq: Two times a day (BID) | ORAL | 0 refills | Status: DC

## 2017-07-26 NOTE — ED Provider Notes (Signed)
Pt informed of BV UA clean Declines STD testing unless positive -  Pt will await phone call informing her of results if positive Understanding expressed Stable for d/c   Eber Hong, MD 07/26/17 [phone]

## 2017-07-26 NOTE — Discharge Instructions (Signed)
See your doctor if you have ongoing symptoms.  Your vaginal exam showed BV - please read attached instructions  If you have positive STD testing you will have a phone call to let you know in the next couple of days  ER for worsening symptoms.

## 2017-07-26 NOTE — ED Provider Notes (Signed)
[facility]-EMERGENCY DEPT Provider Note   CSN: [id number] Arrival date & time: 07/26/17  4098     History   Chief Complaint Chief Complaint  Patient presents with  . Vaginal Discharge    HPI Annette Armstrong is a 31 y.o. female.   Dysuria   This is a new problem. The current episode started yesterday. The problem occurs every urination. The problem has not changed since onset.The quality of the pain is described as burning. The pain is moderate. There has been no fever. She is sexually active. Associated symptoms include discharge, frequency and urgency. Pertinent negatives include no chills, no vomiting and no hematuria. She has tried nothing for the symptoms. Her past medical history is significant for recurrent UTIs.    Past Medical History:  Diagnosis Date  . Breast disorder     There are no active problems to display for this patient.   Past Surgical History:  Procedure Laterality Date  . CESAREAN SECTION      OB History    Gravida Para Term Preterm AB Living   SAB TAB Ectopic Multiple Live Births                   Home Medications    Prior to Admission medications   Medication Sig Start Date End Date Taking? Authorizing Provider  baclofen (LIORESAL) 10 MG tablet Take 1 tablet (10 mg total) by mouth 3 (three) times daily. 04/12/16 05/12/16  Ivery Quale, PA-C  Boric Acid CRYS 1 capsule by Does not apply route 2 (two) times daily. 07/26/17   Eber Hong, MD  cyclobenzaprine (FLEXERIL) 10 MG tablet Take 1 tablet (10 mg total) by mouth 3 (three) times daily. 04/21/16   Ivery Quale, PA-C  diclofenac (VOLTAREN) 75 MG EC tablet Take 1 tablet (75 mg total) by mouth 2 (two) times daily. 04/12/16   Ivery Quale, PA-C  meloxicam (MOBIC) 15 MG tablet Take 1 tablet (15 mg total) by mouth daily. 04/21/16   Ivery Quale, PA-C  metroNIDAZOLE (FLAGYL) 500 MG tablet Take 1 tablet (500 mg total) by mouth 2 (two) times daily. 07/26/17   Eber Hong, MD    metroNIDAZOLE (METROGEL) 0.75 % vaginal gel Place 1 Applicatorful vaginally 2 (two) times daily. Disp 10 applicators... 07/26/17 07/31/17  Eber Hong, MD  Multiple Vitamins-Minerals (HAIR SKIN AND NAILS FORMULA PO) Take 1 tablet by mouth 3 (three) times daily.    [provider]  Multiple Vitamins-Minerals (MULTIVITAMIN ADULT PO) Take 1 tablet by mouth daily.    [provider]    Family History Family History  Problem Relation Age of Onset  . Hypertension Mother     Social History Social History  Substance Use Topics  . Smoking status: Current Every Day Smoker    Packs/day: 0.50    Types: Cigarettes  . Smokeless tobacco: Never Used  . Alcohol use Yes     Comment: occ     Allergies   Coconut flavor   Review of Systems Review of Systems  Constitutional: Negative for chills.  Gastrointestinal: Negative for vomiting.  Genitourinary: Positive for dysuria, frequency and urgency. Negative for hematuria.  All other systems reviewed and are negative.    Physical Exam Updated Vital Signs BP 114/68 (BP Location: Left Arm)   Pulse 64   Temp 98.1 F (36.7 C) (Oral)   Resp 18   Ht  (1.575 m)   Wt 55.3 kg (122  lb)   LMP 07/12/2017   SpO2 100%   BMI 22.31 kg/m   Physical Exam  Constitutional: She is oriented to person, place, and time. She appears well-developed and well-nourished. No distress.  HENT:  Head: Normocephalic and atraumatic.  Right Ear: Hearing normal.  Left Ear: Hearing normal.  Nose: Nose normal.  Mouth/Throat: Oropharynx is clear and moist and mucous membranes are normal.  Eyes: Pupils are equal, round, and reactive to light. Conjunctivae and EOM are normal.  Neck: Normal range of motion. Neck supple.  Cardiovascular: Regular rhythm, S1 normal and S2 normal.  Exam reveals no gallop and no friction rub.   No murmur heard. Pulmonary/Chest: Effort normal and breath sounds normal. No respiratory distress. She exhibits no  tenderness.  Abdominal: Soft. Normal appearance and bowel sounds are normal. There is no hepatosplenomegaly. There is no tenderness. There is no rebound, no guarding, no tenderness at McBurney's point and negative Murphy's sign. No hernia.  Genitourinary: Vagina normal. Right adnexum displays no mass and no tenderness. Left adnexum displays no mass and no tenderness.  Musculoskeletal: Normal range of motion.  Neurological: She is alert and oriented to person, place, and time. She has normal strength. No cranial nerve deficit or sensory deficit. Coordination normal. GCS eye subscore is 4. GCS verbal subscore is 5. GCS motor subscore is 6.  Skin: Skin is warm, dry and intact. No rash noted. No cyanosis.  Psychiatric: She has a normal mood and affect. Her speech is normal and behavior is normal. Thought content normal.  Nursing note and vitals reviewed.    ED Treatments / Results  Labs (all labs ordered are listed, but only abnormal results are displayed) Labs Reviewed  WET PREP, GENITAL - Abnormal; Notable for the following:       Result Value   Clue Cells Wet Prep HPF POC PRESENT (*)    WBC, Wet Prep HPF POC FEW (*)    All other components within normal limits  URINALYSIS, ROUTINE W REFLEX MICROSCOPIC  POC URINE PREG, ED  GC/CHLAMYDIA PROBE AMP ([place]) NOT AT [facility]    EKG  EKG Interpretation None       Radiology No results found.  Procedures Procedures (including critical care time)  Medications Ordered in ED Medications - No data to display   Initial Impression / Assessment and Plan / ED Course  I have reviewed the triage vital signs and the nursing notes.  Pertinent labs & imaging results that were available during my care of the patient were reviewed by me and considered in my medical decision making (see chart for details).     Patient complains of urinary frequencyand vaginal discharge with odor. She has a history of recurrent STD. Pelvic exam did not show  any significant discharge or cervical motion tenderness. Awaiting urinalysis. Signed out to oncoming ER physician, treated for UTI if urinalysis is abnormal, otherwise consider STD treatment.  Final Clinical Impressions(s) / ED Diagnoses   Final diagnoses:  BV (bacterial vaginosis)  Urinary frequency    New Prescriptions Discharge Medication List as of 07/26/2017  7:44 AM    START taking these medications   Details  metroNIDAZOLE (FLAGYL) 500 MG tablet Take 1 tablet (500 mg total) by mouth 2 (two) times daily., Starting Sun 07/26/2017, Print         [place], Canary Brim, MD 07/27/17 [phone]

## 2017-07-26 NOTE — ED Triage Notes (Signed)
Pt c/o frequent urination and vaginal discharge with odor,

## 2017-07-27 LAB — GC/CHLAMYDIA PROBE AMP (~~LOC~~) NOT AT ARMC
Chlamydia: NEGATIVE
Neisseria Gonorrhea: NEGATIVE

## 2017-11-14 ENCOUNTER — Other Ambulatory Visit: Payer: Self-pay

## 2017-11-14 ENCOUNTER — Encounter (HOSPITAL_COMMUNITY): Payer: Self-pay

## 2017-11-14 ENCOUNTER — Emergency Department (HOSPITAL_COMMUNITY)
Admission: EM | Admit: 2017-11-14 | Discharge: 2017-11-14 | Disposition: A | Discharge status: Home / Self Care | Payer: Self-pay | Attending: Emergency Medicine | Admitting: Emergency Medicine

## 2017-11-14 DIAGNOSIS — L0231 Cutaneous abscess of buttock: Secondary | ICD-10-CM | POA: Insufficient documentation

## 2017-11-14 DIAGNOSIS — Z79899 Other long term (current) drug therapy: Secondary | ICD-10-CM | POA: Insufficient documentation

## 2017-11-14 DIAGNOSIS — L309 Dermatitis, unspecified: Secondary | ICD-10-CM | POA: Insufficient documentation

## 2017-11-14 DIAGNOSIS — L0291 Cutaneous abscess, unspecified: Secondary | ICD-10-CM

## 2017-11-14 DIAGNOSIS — F17210 Nicotine dependence, cigarettes, uncomplicated: Secondary | ICD-10-CM | POA: Insufficient documentation

## 2017-11-14 MED ORDER — HYDROCORTISONE 2.5 % EX LOTN: TOPICAL_LOTION | Freq: Two times a day (BID) | CUTANEOUS | 0 refills | Status: DC

## 2017-11-14 MED ORDER — LIDOCAINE-EPINEPHRINE 1 %-1:200000 IJ SOLN
10.0000 mL | Freq: Once | INTRAMUSCULAR | Status: AC
Start: 2017-11-14 — End: 2017-11-14
  Administered 2017-11-14: 10 mL via INTRADERMAL
  Filled 2017-11-14: qty 30

## 2017-11-14 MED ORDER — SULFAMETHOXAZOLE-TRIMETHOPRIM 800-160 MG PO TABS: 1.0000 | ORAL_TABLET | Freq: Two times a day (BID) | ORAL | 0 refills | Status: AC

## 2017-11-14 NOTE — Discharge Instructions (Signed)
Please continue using Eucerin lotion multiple times per day to improve her symptoms.  You may take over-the-counter Benadryl as well for the itching, however do not drive, work or operate machinery while taking this medicine.  He may use hydrocortisone cream 2 times a day over the areas that are itchy.  Please take antibiotic until the course is complete.  Please follow-up with dermatology and return to the ER for any new or worsening symptoms.

## 2017-11-14 NOTE — ED Triage Notes (Signed)
Patient reports of rash to face and rib area. States [facility] given cream but not working. Also reports of abscess to right buttocks. Has used alcohol and peroxide on skin x1 week.

## 2017-11-14 NOTE — ED Provider Notes (Signed)
[facility] Provider Note   CSN: [id number] Arrival date & time: 11/14/17  0749     History   Chief Complaint Chief Complaint  Patient presents with  . Rash  . Abscess    HPI Annette Armstrong is a 32 y.o. female.  HPI   Patient is a 32 year old female who presents to the ED today complaining of an itchy rash that has been present for several months.  Rash is located under the bilateral axilla, along the posterior lateral upper back, and along the posterior surfaces of the bilateral arms.  [facility] and was diagnosed with eczema and told to use Eucerin lotion.  States she has been using Eucerin lotion and rash has been improving.  States there used to be dry areas with plaques and those have resolved, however she is still having some itching to the area and is concerned that there are still areas of darkened skin.  She denies any drainage or broken areas of skin. Denies fevers or chills.  Denies any lip/tongue swelling, difficulty swallowing, difficulty breathing, chest pain, or any other symptoms.   Also reports abscess to right upper buttock that has been present for several days.  Area is mildly painful about 3 out of 10 pain.  She has tried alcohol and peroxide on the skin however has not had no relief.  She denies any drainage, redness, warmth.  Denies any pilonidal or perirectal involvement.  Also reports that she has had acne to her face for multiple years.  Does not report any changes with the acne recently.  He is to be seen by dermatology for this, but has not seen or treated for this in years.  Denies any new soaps, detergents, new foods, or new medications.  Past Medical History:  Diagnosis Date  . Breast disorder     There are no active problems to display for this patient.   Past Surgical History:  Procedure Laterality Date  . CESAREAN SECTION      OB History    Gravida Para Term Preterm AB Living   1 1 1          SAB TAB Ectopic Multiple Live Births                   Home Medications    Prior to Admission medications   Medication Sig Start Date End Date Taking? Authorizing Provider  baclofen (LIORESAL) 10 MG tablet Take 1 tablet (10 mg total) by mouth 3 (three) times daily. 04/12/16 05/12/16  Ivery QualeBryant, Hobson, PA-C  Boric Acid CRYS 1 capsule by Does not apply route 2 (two) times daily. 07/26/17   Eber HongMiller, Brian, MD  cyclobenzaprine (FLEXERIL) 10 MG tablet Take 1 tablet (10 mg total) by mouth 3 (three) times daily. 04/21/16   Ivery QualeBryant, Hobson, PA-C  diclofenac (VOLTAREN) 75 MG EC tablet Take 1 tablet (75 mg total) by mouth 2 (two) times daily. 04/12/16   Ivery QualeBryant, Hobson, PA-C  hydrocortisone 2.5 % lotion Apply topically 2 (two) times daily. Apply topically two times per day over areas that are itchy. 11/14/17   Kavita Bartl S, PA-C  meloxicam (MOBIC) 15 MG tablet Take 1 tablet (15 mg total) by mouth daily. 04/21/16   Ivery QualeBryant, Hobson, PA-C  metroNIDAZOLE (FLAGYL) 500 MG tablet Take 1 tablet (500 mg total) by mouth 2 (two) times daily. 07/26/17   Eber HongMiller, Brian, MD  Multiple Vitamins-Minerals (HAIR SKIN AND NAILS FORMULA PO) Take 1 tablet by  mouth 3 (three) times daily.    [provider]  Multiple Vitamins-Minerals (MULTIVITAMIN ADULT PO) Take 1 tablet by mouth daily.    [provider]  sulfamethoxazole-trimethoprim (BACTRIM DS,SEPTRA DS) 800-160 MG tablet Take 1 tablet by mouth 2 (two) times daily for 5 days. 11/14/17 11/19/17  Nikeshia Keetch S, PA-C    Family History Family History  Problem Relation Age of Onset  . Hypertension Mother     Social History Social History   Tobacco Use  . Smoking status: Current Every Day Smoker    Packs/day: 0.50    Types: Cigarettes  . Smokeless tobacco: Never Used  Substance Use Topics  . Alcohol use: Yes    Comment: occ  . Drug use: Yes    Types: Marijuana     Allergies   Coconut flavor   Review of Systems Review of Systems  Constitutional:  Negative for chills and fever.  HENT: Negative for trouble swallowing.        No throat swelling, no lip/tongue swelling  Respiratory: Negative for shortness of breath and wheezing.   Cardiovascular: Negative for chest pain.  Gastrointestinal: Negative for nausea and vomiting.  Musculoskeletal: Negative for myalgias.  Skin: Positive for rash.       abscess  Neurological: Negative for headaches.     Physical Exam Updated Vital Signs BP (!) 107/49   Pulse 64   Temp 98.6 F (37 C) (Oral)   Resp 16   Ht 5\' 2"  (1.575 m)   Wt 54.9 kg (121 lb)   LMP 10/29/2017   SpO2 100%   BMI 22.13 kg/m   Physical Exam  Constitutional: She appears well-developed and well-nourished. No distress.  HENT:  Head: Normocephalic and atraumatic.  Mouth/Throat: Oropharynx is clear and moist.  No lip/tongue swelling.  Airway patent.  Tolerating secretions. Some moderate acne to bilateral cheeks with no evidence of cellulitis or infection.  Eyes: Conjunctivae and EOM are normal.  Neck: Normal range of motion. Neck supple.  Cardiovascular: Normal rate, regular rhythm and normal heart sounds.  Pulmonary/Chest: Effort normal and breath sounds normal. No stridor. No respiratory distress. She has no wheezes.  Abdominal: She exhibits no distension.  Musculoskeletal: Normal range of motion. She exhibits no edema.  Lymphadenopathy:    She has no cervical adenopathy.  Neurological: She is alert.  Skin: Skin is warm and dry. Capillary refill takes less than 2 seconds.  Large area of darkened skin under the bilateral axilla.  Some patches of darkened skin to posterior lateral upper back and some smaller areas to mid lateral back. No significant areas of darkened skin to bilat arms.  No plaques noted.  No pustules, drainage, erythema, warmth or signs of superimposed infection.  Areas of darkened skin consistent with healing eczema.  Also has 2cm abscess to left upper buttock that is not draining. Some surrounding  induration and fluctuance with mild TTP noted.  No erythema or warmth.  No pilonidal or perirectal involvement.  Psychiatric: She has a normal mood and affect.     ED Treatments / Results  Labs (all labs ordered are listed, but only abnormal results are displayed) Labs Reviewed - No data to display  EKG  EKG Interpretation None       Radiology No results found.  Procedures .Marland KitchenIncision and Drainage Date/Time: 11/14/2017 8:51 AM Performed by: Karrie Meres, PA-C Authorized by: Karrie Meres, PA-C   Consent:    Consent obtained:  Verbal   Consent given by:  Patient   Risks discussed:  Incomplete drainage, infection and pain   Alternatives discussed:  No treatment Location:    Type:  Abscess   Size:  2   Location: right lateral upper buttock. Pre-procedure details:    Skin preparation:  Betadine Anesthesia (see MAR for exact dosages):    Anesthesia method:  Local infiltration   Local anesthetic:  Lidocaine 1% WITH epi Procedure type:    Complexity:  Simple Procedure details:    Needle aspiration: no     Incision types:  Stab incision   Incision depth:  Dermal   Scalpel blade:  11   Wound management:  Probed and deloculated and irrigated with saline   Drainage:  Bloody and purulent   Drainage amount:  Scant   Wound treatment:  Wound left open (dressing applied)   Packing materials:  None Post-procedure details:    Patient tolerance of procedure:  Tolerated well, no immediate complications    (including critical care time)   Medications Ordered in ED Medications  lidocaine-EPINEPHrine (XYLOCAINE-EPINEPHrine) 1 %-1:200000 (PF) injection 10 mL (10 mLs Intradermal Given by Other 11/14/17 1610)     Initial Impression / Assessment and Plan / ED Course  I have reviewed the triage vital signs and the nursing notes.  Pertinent labs & imaging results that were available during my care of the patient were reviewed by me and considered in my medical decision  making (see chart for details).     Final Clinical Impressions(s) / ED Diagnoses   Final diagnoses:  Eczema, unspecified type  Abscess   Rash to back and axilla consistent with healing eczema. Patient endorses that it has improved since using eucerin lotion. Patient denies any difficulty breathing or swallowing.  Pt has a patent airway without stridor and is handling secretions without difficulty; no angioedema. No blisters, no pustules, no warmth, no draining sinus tracts, no superficial abscesses, no bullous impetigo, no vesicles, no desquamation, no target lesions with dusky purpura or a central bulla. Not tender to touch. No concern for superimposed infection. No concern for SJS, TEN, TSS, tick borne illness, syphilis or other life-threatening condition. Will discharge home with short course of steroid cream and recommend Benadryl as needed for pruritis.  Patient also with skin abscess amenable to incision and drainage.  Abscess was not large enough to warrant packing or drain, advised wound recheck with PCP. Encouraged home warm soaks and flushing.  No signs of cellulitis is surrounding skin.  Will d/c to home. D/c with bactrim.  Advised dermatology f/u for acne.  Will d/c with dermatology f/u recommendation as well as PCP f/u for wound recheck and good return precautions. Pt understands plan and agrees to follow up.   ED Discharge Orders        Ordered    sulfamethoxazole-trimethoprim (BACTRIM DS,SEPTRA DS) 800-160 MG tablet  2 times daily     11/14/17 0851    hydrocortisone 2.5 % lotion  2 times daily     11/14/17 0851       Karrie Meres, PA-C 11/14/17 1022    Linwood Dibbles, MD 11/16/17 1945

## 2017-11-20 ENCOUNTER — Emergency Department (HOSPITAL_COMMUNITY)
Admission: EM | Admit: 2017-11-20 | Discharge: 2017-11-20 | Disposition: A | Discharge status: Home / Self Care | Payer: Self-pay | Attending: Emergency Medicine | Admitting: Emergency Medicine

## 2017-11-20 ENCOUNTER — Other Ambulatory Visit: Payer: Self-pay

## 2017-11-20 ENCOUNTER — Encounter (HOSPITAL_COMMUNITY): Payer: Self-pay | Admitting: Emergency Medicine

## 2017-11-20 DIAGNOSIS — Z79899 Other long term (current) drug therapy: Secondary | ICD-10-CM | POA: Insufficient documentation

## 2017-11-20 DIAGNOSIS — K121 Other forms of stomatitis: Secondary | ICD-10-CM | POA: Insufficient documentation

## 2017-11-20 DIAGNOSIS — F17210 Nicotine dependence, cigarettes, uncomplicated: Secondary | ICD-10-CM | POA: Insufficient documentation

## 2017-11-20 MED ORDER — LIDOCAINE VISCOUS 2 % MT SOLN
15.0000 mL | Freq: Once | OROMUCOSAL | Status: AC
  Administered 2017-11-20: 15 mL via OROMUCOSAL
  Filled 2017-11-20: qty 15

## 2017-11-20 MED ORDER — DEXAMETHASONE SODIUM PHOSPHATE 10 MG/ML IJ SOLN
10.0000 mg | Freq: Once | INTRAMUSCULAR | Status: AC
  Administered 2017-11-20: 10 mg via INTRAMUSCULAR
  Filled 2017-11-20: qty 1

## 2017-11-20 MED ORDER — MAGIC MOUTHWASH W/LIDOCAINE
ORAL | 0 refills | Status: DC
Start: 2017-11-20 — End: 2018-03-24

## 2017-11-20 NOTE — Discharge Instructions (Signed)
Your examination is consistent with stomatitis, and/or mucositis.  Please cleanse the mouth thoroughly.  Use Magic mouthwash every 3 hours as needed for pain and discomfort.  Please see the ear nose and throat specialist listed on your discharge instructions if this is not improving.  Please wash hands frequently.  Increase fluids.  Return to the emergency department if any problems with swallowing, difficulty with your breathing, or other emergent changes.

## 2017-11-20 NOTE — ED Triage Notes (Signed)
Pt states she was recently here last week for a boil, started taken an antibiotic.  She started itching in her fingers and a white patch to her right lower jaw with pain after taking antibiotics. Swelling noted to right lower jaw.

## 2017-11-20 NOTE — ED Provider Notes (Signed)
[facility] Provider [facility]   CSN: [id number] Arrival date & time: 11/20/17  0753     History   Chief Complaint Chief Complaint  Patient presents with  . Facial Swelling    HPI Annette Armstrong is a 32 y.o. female.  Patient is a [age] year old female who presents to the emergency department with with mouth pain.  Patient states she has had 3 days of pain and swelling of her mouth.  She states that she has not had any known injury.  She has not had any recent changes in her mouthwash or toothpaste.  She has not had any recent upper respiratory symptoms that she is aware of.  No high fevers.  There is no drainage from the area, but she states that it is raw and has some white areas around it.  She experienced some similar issues on the left side several weeks ago, and now she has some on the right side.  She states that it makes her face feel like that it is swollen on the right.  She has no difficulty with her breathing, she has no difficulty with her swallowing.  It is of note that she is a smoker.  She does not chew tobacco, or dip snuff.   The history is provided by the patient.    Past Medical History:  Diagnosis Date  . Breast disorder     There are no active problems to display for this patient.   Past Surgical History:  Procedure Laterality Date  . CESAREAN SECTION      OB History    Gravida Para Term Preterm AB Living   1 1 1          SAB TAB Ectopic Multiple Live Births                   Home Medications    Prior to Admission medications   Medication Sig Start Date End Date Taking? Authorizing Provider  baclofen (LIORESAL) 10 MG tablet Take 1 tablet (10 mg total) by mouth 3 (three) times daily. 04/12/16 05/12/16  Ivery QualeBryant, Maddalynn Barnard, PA-C  Boric Acid CRYS 1 capsule by Does not apply route 2 (two) times daily. 07/26/17   Eber HongMiller, Brian, MD  cyclobenzaprine (FLEXERIL) 10 MG tablet Take 1 tablet (10 mg total) by mouth 3 (three) times daily. 04/21/16    Ivery QualeBryant, Conlan Miceli, PA-C  diclofenac (VOLTAREN) 75 MG EC tablet Take 1 tablet (75 mg total) by mouth 2 (two) times daily. 04/12/16   Ivery QualeBryant, Ilyaas Musto, PA-C  hydrocortisone 2.5 % lotion Apply topically 2 (two) times daily. Apply topically two times per day over areas that are itchy. 11/14/17   Couture, Cortni S, PA-C  meloxicam (MOBIC) 15 MG tablet Take 1 tablet (15 mg total) by mouth daily. 04/21/16   Ivery QualeBryant, Nikhita Mentzel, PA-C  metroNIDAZOLE (FLAGYL) 500 MG tablet Take 1 tablet (500 mg total) by mouth 2 (two) times daily. 07/26/17   Eber HongMiller, Brian, MD  Multiple Vitamins-Minerals (HAIR SKIN AND NAILS FORMULA PO) Take 1 tablet by mouth 3 (three) times daily.    [provider]  Multiple Vitamins-Minerals (MULTIVITAMIN ADULT PO) Take 1 tablet by mouth daily.    [provider]    Family History Family History  Problem Relation Age of Onset  . Hypertension Mother     Social History Social History   Tobacco Use  . Smoking status: Current Every Day Smoker    Packs/day: 0.50    Types: Cigarettes  . Smokeless  tobacco: Never Used  Substance Use Topics  . Alcohol use: Yes    Comment: occ  . Drug use: Yes    Types: Marijuana     Allergies   Coconut flavor   Review of Systems Review of Systems  Constitutional: Negative for activity change.       All ROS Neg except as noted in HPI  HENT: Positive for mouth sores. Negative for nosebleeds.   Eyes: Negative for photophobia and discharge.  Respiratory: Negative for cough, shortness of breath and wheezing.   Cardiovascular: Negative for chest pain and palpitations.  Gastrointestinal: Negative for abdominal pain and blood in stool.  Genitourinary: Negative for dysuria, frequency and hematuria.  Musculoskeletal: Negative for arthralgias, back pain and neck pain.  Skin: Negative.   Neurological: Negative for dizziness, seizures and speech difficulty.  Psychiatric/Behavioral: Negative for confusion and hallucinations.     Physical  Exam Updated Vital Signs BP 132/62 (BP Location: Right Arm)   Pulse 73   Temp 98.2 F (36.8 C) (Oral)   Resp 18   Ht 5\' 2"  (1.575 m)   Wt 54.9 kg (121 lb)   LMP 10/29/2017   SpO2 100%   BMI 22.13 kg/m   Physical Exam  Constitutional: She is oriented to person, place, and time. She appears well-developed and well-nourished.  Non-toxic appearance.  HENT:  Head: Normocephalic.  Right Ear: Tympanic membrane and external ear normal.  Left Ear: Tympanic membrane and external ear normal.  Mouth/Throat:    No dental abscess noted. No swelling under the tongue. Airway is patent.  Eyes: EOM and lids are normal. Pupils are equal, round, and reactive to light.  Neck: Normal range of motion. Neck supple. Carotid bruit is not present.  Few cervical and few submental lymph nodes palpable.  Cardiovascular: Normal rate, regular rhythm, normal heart sounds, intact distal pulses and normal pulses.  Pulmonary/Chest: Breath sounds normal. No respiratory distress.  Abdominal: Soft. Bowel sounds are normal. There is no tenderness. There is no guarding.  Musculoskeletal: Normal range of motion.  Lymphadenopathy:       Head (right side): No submandibular adenopathy present.       Head (left side): No submandibular adenopathy present.    She has no cervical adenopathy.  Neurological: She is alert and oriented to person, place, and time. She has normal strength. No cranial nerve deficit or sensory deficit.  Skin: Skin is warm and dry.  Psychiatric: She has a normal mood and affect. Her speech is normal.  Nursing note and vitals reviewed.    ED Treatments / Results  Labs (all labs ordered are listed, but only abnormal results are displayed) Labs Reviewed - No data to display  EKG  EKG Interpretation None       Radiology No results found.  Procedures Procedures (including critical care time)  Medications Ordered in ED Medications - No data to display   Initial Impression /  Assessment and Plan / ED Course  I have reviewed the triage vital signs and the nursing notes.  Pertinent labs & imaging results that were available during my care of the patient were reviewed by me and considered in my medical decision making (see chart for details).       Final Clinical Impressions(s) / ED Diagnoses MDM Patient gives a 3-day history of sores in her mouth mostly on the right side.  The examination favors a stomatitis.  It is of note however the patient is a smoker.  She does not use  snuff or chewing tobacco.  I have asked the patient to thoroughly cleanse the mouth a few times daily.  To use Magic mouthwash for her discomfort.  To use Tylenol, and/or ibuprofen for soreness.  I have asked the patient to see ear nose and throat for additional evaluation if this is not improving to screen for any advancing oral condition.   Final diagnoses:  Stomatitis    ED Discharge Orders    None       Ivery Quale, PA-C 11/20/17 4098    Maia Plan, MD 11/20/17 [phone]

## 2018-03-24 ENCOUNTER — Emergency Department (HOSPITAL_COMMUNITY)
Admission: EM | Admit: 2018-03-24 | Discharge: 2018-03-24 | Disposition: A | Discharge status: Home / Self Care | Payer: Medicaid Other | Attending: Emergency Medicine | Admitting: Emergency Medicine

## 2018-03-24 ENCOUNTER — Other Ambulatory Visit: Payer: Self-pay

## 2018-03-24 ENCOUNTER — Encounter (HOSPITAL_COMMUNITY): Payer: Self-pay | Admitting: Emergency Medicine

## 2018-03-24 DIAGNOSIS — N390 Urinary tract infection, site not specified: Secondary | ICD-10-CM

## 2018-03-24 DIAGNOSIS — B9689 Other specified bacterial agents as the cause of diseases classified elsewhere: Secondary | ICD-10-CM | POA: Insufficient documentation

## 2018-03-24 DIAGNOSIS — F17210 Nicotine dependence, cigarettes, uncomplicated: Secondary | ICD-10-CM | POA: Insufficient documentation

## 2018-03-24 DIAGNOSIS — N760 Acute vaginitis: Secondary | ICD-10-CM | POA: Insufficient documentation

## 2018-03-24 LAB — URINALYSIS, ROUTINE W REFLEX MICROSCOPIC
Bilirubin Urine: NEGATIVE
Glucose, UA: NEGATIVE mg/dL
Hgb urine dipstick: NEGATIVE
KETONES UR: NEGATIVE mg/dL
Nitrite: NEGATIVE
PROTEIN: NEGATIVE mg/dL
SPECIFIC GRAVITY, URINE: 1.024 (ref 1.005–1.030)
pH: 7 (ref 5.0–8.0)

## 2018-03-24 LAB — WET PREP, GENITAL
Clue Cells Wet Prep HPF POC: NONE SEEN
Sperm: NONE SEEN
Trich, Wet Prep: NONE SEEN
YEAST WET PREP: NONE SEEN

## 2018-03-24 LAB — PREGNANCY, URINE: PREG TEST UR: NEGATIVE

## 2018-03-24 MED ORDER — CEPHALEXIN 500 MG PO CAPS: 500.0000 mg | ORAL_CAPSULE | Freq: Three times a day (TID) | ORAL | 0 refills | Status: DC

## 2018-03-24 MED ORDER — METRONIDAZOLE 500 MG PO TABS
2000.0000 mg | ORAL_TABLET | Freq: Once | ORAL | Status: AC
  Administered 2018-03-24: 2000 mg via ORAL
  Filled 2018-03-24: qty 4

## 2018-03-24 NOTE — Discharge Instructions (Addendum)
Take ibuprofen, tylenol and use heat pads for pain. Return if pain is worse at any time, especially if associated with fever or vomiting. Recheck with your doctor this week.

## 2018-03-24 NOTE — ED Provider Notes (Signed)
[facility] EMERGENCY DEPARTMENT Provider Note   CSN: [id number] Arrival date & time: 03/24/18  1053     History   Chief Complaint Chief Complaint  Patient presents with  . Abdominal Pain    HPI Annette Armstrong is a 32 y.o. female.  HPI   32 year old female presents today with crampy lower abdominal pain and urinary frequency.  She has had some vaginal discharge.  She denies any recent sexually transmitted diseases and has had 2 sexual partners in the last 6 months.  She denies nausea, vomiting, diarrhea, fever, chills, chest pain, dyspnea.  She states she has had some similar symptoms in the past that were thought to be secondary to a ovarian cyst but states that they did not show one on her ultrasound.  She is followed by Dr. Emelda Fear.  She has had one pregnancy.  She states she is sexually active and is not currently using birth control.  Past Medical History:  Diagnosis Date  . Breast disorder     There are no active problems to display for this patient.   Past Surgical History:  Procedure Laterality Date  . CESAREAN SECTION       OB History    Gravida  1   Para  1   Term  1   Preterm      AB      Living        SAB      TAB      Ectopic      Multiple      Live Births               Home Medications    Prior to Admission medications   Medication Sig Start Date End Date Taking? Authorizing Provider  Boric Acid CRYS 1 capsule by Does not apply route 2 (two) times daily. Patient taking differently: 1 capsule by Does not apply route 2 (two) times daily as needed (yeast infections).  07/26/17  Yes Eber Hong, MD  cephALEXin (KEFLEX) 500 MG capsule Take 1 capsule (500 mg total) by mouth 3 (three) times daily. 03/24/18   Margarita Grizzle, MD    Family History Family History  Problem Relation Age of Onset  . Hypertension Mother     Social History Social History   Tobacco Use  . Smoking status: Current Every Day Smoker    Packs/day: 0.50   Types: Cigarettes  . Smokeless tobacco: Never Used  Substance Use Topics  . Alcohol use: Yes    Comment: occ  . Drug use: Yes    Types: Marijuana     Allergies   Coconut flavor   Review of Systems Review of Systems  All other systems reviewed and are negative.    Physical Exam Updated Vital Signs BP (!) 121/56 (BP Location: Right Arm)   Pulse 75   Temp 98.3 F (36.8 C) (Oral)   Resp 18   Ht 1.575 m (5\' 2" )   Wt 53.5 kg (118 lb)   LMP 02/14/2018   SpO2 100%   BMI 21.58 kg/m   Physical Exam  Constitutional: She appears well-developed and well-nourished.  HENT:  Head: Normocephalic and atraumatic.  Mouth/Throat: Oropharynx is clear and moist.  Eyes: Pupils are equal, round, and reactive to light.  Cardiovascular: Normal rate.  Pulmonary/Chest: Effort normal.  Abdominal: Normal appearance and bowel sounds are normal. There is no tenderness.  Genitourinary: Uterus normal. Uterus is not tender. Cervix exhibits no discharge. Right adnexum displays tenderness.  No tenderness in the vagina. Vaginal discharge found.  Genitourinary Comments: Mild right adnexal ttp  Neurological: She is alert.  Skin: Skin is warm and dry. Capillary refill takes less than 2 seconds.  Psychiatric: She has a normal mood and affect.  Nursing note and vitals reviewed.    ED Treatments / Results  Labs (all labs ordered are listed, but only abnormal results are displayed) Labs Reviewed  WET PREP, GENITAL - Abnormal; Notable for the following components:      Result Value   WBC, Wet Prep HPF POC MANY (*)    All other components within normal limits  URINALYSIS, ROUTINE W REFLEX MICROSCOPIC - Abnormal; Notable for the following components:   APPearance HAZY (*)    Leukocytes, UA MODERATE (*)    Bacteria, UA RARE (*)    All other components within normal limits  PREGNANCY, URINE  GC/CHLAMYDIA PROBE AMP ([place]) NOT AT [facility]    EKG None  Radiology No results  found.  Procedures Procedures (including critical care time)  Medications Ordered in ED Medications  metroNIDAZOLE (FLAGYL) tablet 2,000 mg (has no administration in time range)     Initial Impression / Assessment and Plan / ED Course  I have reviewed the triage vital signs and the nursing notes.  Pertinent labs & imaging results that were available during my care of the patient were reviewed by me and considered in my medical decision making (see chart for details).      Final Clinical Impressions(s) / ED Diagnoses   Final diagnoses:  Bacterial vaginitis  Urinary tract infection without hematuria, site unspecified    ED Discharge Orders        Ordered    cephALEXin (KEFLEX) 500 MG capsule  3 times daily     03/24/18 1610       Margarita Grizzleay, Petina Muraski, MD 03/24/18 1614

## 2018-03-24 NOTE — ED Triage Notes (Signed)
Pt c/o lower abd cramping with urinary freq. Denies vag dc. Nad. Denies nv//d

## 2018-03-25 LAB — GC/CHLAMYDIA PROBE AMP (~~LOC~~) NOT AT ARMC
Chlamydia: NEGATIVE
Neisseria Gonorrhea: NEGATIVE

## 2018-05-24 ENCOUNTER — Emergency Department (HOSPITAL_COMMUNITY)
Admission: EM | Admit: 2018-05-24 | Discharge: 2018-05-24 | Disposition: A | Discharge status: Home / Self Care | Payer: Self-pay | Attending: Emergency Medicine | Admitting: Emergency Medicine

## 2018-05-24 ENCOUNTER — Encounter (HOSPITAL_COMMUNITY): Payer: Self-pay

## 2018-05-24 ENCOUNTER — Other Ambulatory Visit: Payer: Self-pay

## 2018-05-24 DIAGNOSIS — B9689 Other specified bacterial agents as the cause of diseases classified elsewhere: Secondary | ICD-10-CM | POA: Insufficient documentation

## 2018-05-24 DIAGNOSIS — F17210 Nicotine dependence, cigarettes, uncomplicated: Secondary | ICD-10-CM | POA: Insufficient documentation

## 2018-05-24 DIAGNOSIS — N760 Acute vaginitis: Secondary | ICD-10-CM | POA: Insufficient documentation

## 2018-05-24 LAB — URINALYSIS, ROUTINE W REFLEX MICROSCOPIC
Bacteria, UA: NONE SEEN
Bilirubin Urine: NEGATIVE
Glucose, UA: NEGATIVE mg/dL
HGB URINE DIPSTICK: NEGATIVE
Ketones, ur: NEGATIVE mg/dL
NITRITE: NEGATIVE
PH: 6 (ref 5.0–8.0)
Protein, ur: NEGATIVE mg/dL
SPECIFIC GRAVITY, URINE: 1.017 (ref 1.005–1.030)

## 2018-05-24 LAB — WET PREP, GENITAL
Sperm: NONE SEEN
Trich, Wet Prep: NONE SEEN
YEAST WET PREP: NONE SEEN

## 2018-05-24 LAB — PREGNANCY, URINE: PREG TEST UR: NEGATIVE

## 2018-05-24 MED ORDER — METRONIDAZOLE 500 MG PO TABS
500.0000 mg | ORAL_TABLET | Freq: Once | ORAL | Status: AC
  Administered 2018-05-24: 500 mg via ORAL
  Filled 2018-05-24: qty 1

## 2018-05-24 MED ORDER — METRONIDAZOLE 500 MG PO TABS: 500.0000 mg | ORAL_TABLET | Freq: Two times a day (BID) | ORAL | 0 refills | Status: DC

## 2018-05-24 NOTE — ED Triage Notes (Signed)
Pt reports was treated 2 weeks ago for a uti.  Pt says she took all of her medication but still has urinary frequency.  Also reports vaginal discharge x 1 week. Denies pain.

## 2018-05-24 NOTE — ED Provider Notes (Signed)
[facility] Provider Note   CSN: [id number] Arrival date & time: 05/24/18  [id number]     History   Chief Complaint Chief Complaint  Patient presents with  . Urinary Frequency    HPI Annette Armstrong is a 32 y.o. female with a history of frequent UTIs, last treated 2 weeks ago with a 7-day course of Keflex presenting with persistent urinary frequency, but also with development of vaginal discharge.  She also has a history of bacterial vaginosis and she states her symptoms feel most like this infection.  She denies fevers or chills, nausea, vomiting or abdominal pain.  She also denies urinary urgency or dysuria.  She is sexually active, not currently on birth control.  Her LMP was July 15 and normal.  She denies risk factors for STDs.  The history is provided by the patient.    Past Medical History:  Diagnosis Date  . Breast disorder     There are no active problems to display for this patient.   Past Surgical History:  Procedure Laterality Date  . CESAREAN SECTION       OB History    Gravida  1   Para  1   Term  1   Preterm      AB      Living        SAB      TAB      Ectopic      Multiple      Live Births               Home Medications    Prior to Admission medications   Medication Sig Start Date End Date Taking? Authorizing Provider  Multiple Vitamin (MULTIVITAMIN) capsule Take 1 capsule by mouth daily.   Yes [provider]  Boric Acid CRYS 1 capsule by Does not apply route 2 (two) times daily. Patient taking differently: 1 capsule by Does not apply route 2 (two) times daily as needed (yeast infections).  07/26/17   Eber HongMiller, Brian, MD  cephALEXin (KEFLEX) 500 MG capsule Take 1 capsule (500 mg total) by mouth 3 (three) times daily. Patient not taking: Reported on 05/24/2018 03/24/18   Margarita Grizzleay, Danielle, MD  metroNIDAZOLE (FLAGYL) 500 MG tablet Take 1 tablet (500 mg total) by mouth 2 (two) times daily. 05/24/18   Burgess AmorIdol, Haja Crego, PA-C      Family History Family History  Problem Relation Age of Onset  . Hypertension Mother     Social History Social History   Tobacco Use  . Smoking status: Current Every Day Smoker    Packs/day: 0.50    Types: Cigarettes  . Smokeless tobacco: Never Used  Substance Use Topics  . Alcohol use: Yes    Comment: occ  . Drug use: Yes    Types: Marijuana     Allergies   Coconut flavor   Review of Systems Review of Systems  Constitutional: Negative for chills and fever.  HENT: Negative.   Eyes: Negative.   Respiratory: Negative for chest tightness and shortness of breath.   Cardiovascular: Negative for chest pain.  Gastrointestinal: Negative for abdominal pain, nausea and vomiting.  Genitourinary: Positive for frequency and vaginal discharge. Negative for dysuria and urgency.  Musculoskeletal: Negative for arthralgias, joint swelling and neck pain.  Skin: Negative.  Negative for rash and wound.  Neurological: Negative for dizziness, weakness, light-headedness, numbness and headaches.  Psychiatric/Behavioral: Negative.      Physical Exam Updated Vital Signs BP (!) 113/52 (  BP Location: Right Arm)   Pulse 71   Temp 98.3 F (36.8 C) (Oral)   Resp 18   Ht 5\' 2"  (1.575 m)   Wt 54.4 kg   LMP 04/28/2018   SpO2 100%   BMI 21.95 kg/m   Physical Exam  Constitutional: She appears well-developed and well-nourished.  HENT:  Head: Normocephalic and atraumatic.  Eyes: Conjunctivae are normal.  Neck: Normal range of motion.  Cardiovascular: Normal rate, regular rhythm, normal heart sounds and intact distal pulses.  Pulmonary/Chest: Effort normal and breath sounds normal. She has no wheezes.  Abdominal: Soft. Bowel sounds are normal. There is no tenderness.  Genitourinary: Uterus normal. Cervix exhibits no motion tenderness, no discharge and no friability. Right adnexum displays no mass, no tenderness and no fullness. Left adnexum displays no mass, no tenderness and no  fullness. Vaginal discharge found.  Genitourinary Comments: Chaperone was present during exam.   Musculoskeletal: Normal range of motion.  Neurological: She is alert.  Skin: Skin is warm and dry.  Psychiatric: She has a normal mood and affect.  Nursing note and vitals reviewed.    ED Treatments / Results  Labs (all labs ordered are listed, but only abnormal results are displayed) Labs Reviewed  WET PREP, GENITAL - Abnormal; Notable for the following components:      Result Value   Clue Cells Wet Prep HPF POC PRESENT (*)    WBC, Wet Prep HPF POC RARE (*)    All other components within normal limits  URINALYSIS, ROUTINE W REFLEX MICROSCOPIC - Abnormal; Notable for the following components:   APPearance HAZY (*)    Leukocytes, UA SMALL (*)    All other components within normal limits  PREGNANCY, URINE  GC/CHLAMYDIA PROBE AMP ([place]) NOT AT [facility]    EKG None  Radiology No results found.  Procedures Procedures (including critical care time)  Medications Ordered in ED Medications  metroNIDAZOLE (FLAGYL) tablet 500 mg (has no administration in time range)     Initial Impression / Assessment and Plan / ED Course  I have reviewed the triage vital signs and the nursing notes.  Pertinent labs & imaging results that were available during my care of the patient were reviewed by me and considered in my medical decision making (see chart for details).     Labs reviewed and discussed with pt. Flagyl started. Prn f/u pcp. Pt aware gc/chlamydia labs pending.  Exam not c/w these possible infections.   Final Clinical Impressions(s) / ED Diagnoses   Final diagnoses:  BV (bacterial vaginosis)    ED Discharge Orders         Ordered    metroNIDAZOLE (FLAGYL) 500 MG tablet  2 times daily     05/24/18 1123           Burgess Amordol, Sota Hetz, Cordelia Poche-C 05/24/18 1127    Benjiman CorePickering, Nathan, MD 05/24/18 [phone]

## 2018-05-24 NOTE — Discharge Instructions (Addendum)
Your exam and labs are consistent with bacterial vaginosis as you suspected.  Your gonorrhea and chlamydia tests are pending (and will take 2 days to result) but I suspect will be negative.

## 2018-05-24 NOTE — ED Notes (Signed)
Patient given discharge instruction, verbalized understand. Patient ambulatory out of the department.  

## 2018-05-25 LAB — GC/CHLAMYDIA PROBE AMP (~~LOC~~) NOT AT ARMC
CHLAMYDIA, DNA PROBE: NEGATIVE
Neisseria Gonorrhea: POSITIVE — AB

## 2018-05-25 NOTE — ED Notes (Addendum)
05/25/18 - 1345 Received call from patient stating that she is having difficulty swallowing PO Flagyl tablets. Requesting liquid version. Approved by Dr. Deretha EmoryZackowski. [facility]

## 2018-05-30 ENCOUNTER — Emergency Department (HOSPITAL_COMMUNITY)
Admission: EM | Admit: 2018-05-30 | Discharge: 2018-05-30 | Disposition: A | Discharge status: Home / Self Care | Payer: Self-pay | Attending: Emergency Medicine | Admitting: Emergency Medicine

## 2018-05-30 ENCOUNTER — Other Ambulatory Visit: Payer: Self-pay

## 2018-05-30 ENCOUNTER — Encounter (HOSPITAL_COMMUNITY): Payer: Self-pay | Admitting: *Deleted

## 2018-05-30 DIAGNOSIS — F1210 Cannabis abuse, uncomplicated: Secondary | ICD-10-CM | POA: Insufficient documentation

## 2018-05-30 DIAGNOSIS — Z79899 Other long term (current) drug therapy: Secondary | ICD-10-CM | POA: Insufficient documentation

## 2018-05-30 DIAGNOSIS — Z202 Contact with and (suspected) exposure to infections with a predominantly sexual mode of transmission: Secondary | ICD-10-CM | POA: Insufficient documentation

## 2018-05-30 DIAGNOSIS — F17210 Nicotine dependence, cigarettes, uncomplicated: Secondary | ICD-10-CM | POA: Insufficient documentation

## 2018-05-30 MED ORDER — AZITHROMYCIN 250 MG PO TABS
1000.0000 mg | ORAL_TABLET | Freq: Once | ORAL | Status: AC
  Administered 2018-05-30: 1000 mg via ORAL
  Filled 2018-05-30: qty 4

## 2018-05-30 MED ORDER — CEFTRIAXONE SODIUM 250 MG IJ SOLR
250.0000 mg | Freq: Once | INTRAMUSCULAR | Status: AC
  Administered 2018-05-30: 250 mg via INTRAMUSCULAR
  Filled 2018-05-30: qty 250

## 2018-05-30 MED ORDER — LIDOCAINE HCL (PF) 1 % IJ SOLN
INTRAMUSCULAR | Status: AC
  Administered 2018-05-30: 2 mL
  Filled 2018-05-30: qty 2

## 2018-05-30 NOTE — ED Triage Notes (Signed)
Pt reports she was called a few days ago after being seen in the ED last week and told she was positive for a STD. Pt denies vaginal discharge.

## 2018-05-30 NOTE — Discharge Instructions (Addendum)
You were treated today for both gonorrhea and chlamydia. Inform your sexual partners regarding their need for treatment. Avoid sex for the next 7 days. Call your regular medical doctor on Monday to schedule a follow up appointment within the next week.  Return to the Emergency Department immediately sooner if worsening.

## 2018-05-30 NOTE — ED Provider Notes (Signed)
[facility]   CSN: [id number] Arrival date & time: 05/30/18  [id number]     History   Chief Complaint Chief Complaint  Patient presents with  . SEXUALLY TRANSMITTED DISEASE    HPI Annette Armstrong is a 32 y.o. female.  HPI  Pt was seen at 0910. Per pt: States she was called a few days ago and told her STD test was positive. Pt was seen in the ED last week for urinary frequency and vaginal discharge. Pt states she was dx BV and took rx flagyl. Pt currently denies any complaints. States she "is just here to get treated." Denies abd pain, no N/V/D, no back pain, no fevers, no rash.   Past Medical History:  Diagnosis Date  . Breast disorder     There are no active problems to display for this patient.   Past Surgical History:  Procedure Laterality Date  . CESAREAN SECTION       OB History    Gravida  1   Para  1   Term  1   Preterm      AB      Living        SAB      TAB      Ectopic      Multiple      Live Births               Home Medications    Prior to Admission medications   Medication Sig Start Date End Date Taking? Authorizing Provider  Boric Acid CRYS 1 capsule by Does not apply route 2 (two) times daily. Patient taking differently: 1 capsule by Does not apply route 2 (two) times daily as needed (yeast infections).  07/26/17   Eber HongMiller, Brian, MD  cephALEXin (KEFLEX) 500 MG capsule Take 1 capsule (500 mg total) by mouth 3 (three) times daily. Patient not taking: Reported on 05/24/2018 03/24/18   Margarita Grizzleay, Danielle, MD  metroNIDAZOLE (FLAGYL) 500 MG tablet Take 1 tablet (500 mg total) by mouth 2 (two) times daily. 05/24/18   Burgess AmorIdol, Julie, PA-C  Multiple Vitamin (MULTIVITAMIN) capsule Take 1 capsule by mouth daily.    [provider]    Family History Family History  Problem Relation Age of Onset  . Hypertension Mother     Social History Social History   Tobacco Use  . Smoking status: Current Every Day  Smoker    Packs/day: 0.50    Types: Cigarettes  . Smokeless tobacco: Never Used  Substance Use Topics  . Alcohol use: Yes    Comment: occ  . Drug use: Yes    Types: Marijuana    Comment: last used 05/29/18     Allergies   Coconut flavor   Review of Systems Review of Systems ROS: Statement: All systems negative except as marked or noted in the HPI; Constitutional: Negative for fever and chills. ; ; Eyes: Negative for eye pain, redness and discharge. ; ; ENMT: Negative for ear pain, hoarseness, nasal congestion, sinus pressure and sore throat. ; ; Cardiovascular: Negative for chest pain, palpitations, diaphoresis, dyspnea and peripheral edema. ; ; Respiratory: Negative for cough, wheezing and stridor. ; ; Gastrointestinal: Negative for nausea, vomiting, diarrhea, abdominal pain, blood in stool, hematemesis, jaundice and rectal bleeding. . ; ; Genitourinary: Negative for dysuria, flank pain and hematuria. ; ; GYN:  No pelvic pain, no vaginal bleeding, no vaginal discharge, no vulvar pain. ;; Musculoskeletal: Negative for back pain and neck  pain. Negative for swelling and trauma.; ; Skin: Negative for pruritus, rash, abrasions, blisters, bruising and skin lesion.; ; Neuro: Negative for headache, lightheadedness and neck stiffness. Negative for weakness, altered level of consciousness, altered mental status, extremity weakness, paresthesias, involuntary movement, seizure and syncope.       Physical Exam Updated Vital Signs BP (!) 104/46 (BP Location: Left Arm)   Pulse 78   Temp 98.6 F (37 C) (Oral)   Resp 16   Ht 5\' 2"  (1.575 m)   Wt 54.4 kg   LMP 05/07/2018   SpO2 100%   BMI 21.95 kg/m   Physical Exam 0915: Physical examination:  Nursing notes reviewed; Vital signs and O2 SAT reviewed;  Constitutional: Well developed, Well nourished, Well hydrated, In no acute distress; Head:  Normocephalic, atraumatic; Eyes: EOMI, PERRL, No scleral icterus; ENMT: Mouth and pharynx normal, Mucous  membranes moist; Neck: Supple, Full range of motion; Cardiovascular: Regular rate and rhythm, No gallop; Respiratory: Breath sounds clear & equal bilaterally, No wheezes.  Speaking full sentences with ease, Normal respiratory effort/excursion; Chest: Nontender, Movement normal; Abdomen: Soft, Nontender, Nondistended, Normal bowel sounds;; Extremities: Peripheral pulses normal, No tenderness, No edema, No calf edema or asymmetry.; Neuro: AA&Ox3, Major CN grossly intact.  Speech clear. No gross focal motor or sensory deficits in extremities. Climbs on and off stretcher easily by herself. Gait steady..; Skin: Color normal, Warm, Dry.   ED Treatments / Results  Labs (all labs ordered are listed, but only abnormal results are displayed)   EKG None  Radiology   Procedures Procedures (including critical care time)  Medications Ordered in ED Medications  cefTRIAXone (ROCEPHIN) injection 250 mg (has no administration in time range)  azithromycin (ZITHROMAX) tablet 1,000 mg (has no administration in time range)     Initial Impression / Assessment and Plan / ED Course  I have reviewed the triage vital signs and the nursing notes.  Pertinent labs & imaging results that were available during my care of the patient were reviewed by me and considered in my medical decision making (see chart for details).   MDM Reviewed: previous chart, nursing note and vitals Reviewed previous: labs   Results for orders placed or performed during the hospital encounter of 05/24/18  Wet prep, genital  Result Value Ref Range   Yeast Wet Prep HPF POC NONE SEEN NONE SEEN   Trich, Wet Prep NONE SEEN NONE SEEN   Clue Cells Wet Prep HPF POC PRESENT (A) NONE SEEN   WBC, Wet Prep HPF POC RARE (A) NONE SEEN   Sperm NONE SEEN   Urinalysis, Routine w reflex microscopic  Result Value Ref Range   Color, Urine YELLOW YELLOW   APPearance HAZY (A) CLEAR   Specific Gravity, Urine 1.017 1.005 - 1.030   pH 6.0 5.0 - 8.0    Glucose, UA NEGATIVE NEGATIVE mg/dL   Hgb urine dipstick NEGATIVE NEGATIVE   Bilirubin Urine NEGATIVE NEGATIVE   Ketones, ur NEGATIVE NEGATIVE mg/dL   Protein, ur NEGATIVE NEGATIVE mg/dL   Nitrite NEGATIVE NEGATIVE   Leukocytes, UA SMALL (A) NEGATIVE   RBC / HPF 0-5 0 - 5 RBC/hpf   WBC, UA 21-50 0 - 5 WBC/hpf   Bacteria, UA NONE SEEN NONE SEEN   Squamous Epithelial / LPF 0-5 0 - 5   Mucus PRESENT    Hyaline Casts, UA PRESENT    Uric Acid Crys, UA PRESENT   Pregnancy, urine  Result Value Ref Range   Preg Test, Ur NEGATIVE  NEGATIVE  GC/Chlamydia probe amp ([place]  Result Value Ref Range   Chlamydia Negative    Neisseria gonorrhea **POSITIVE** (A)     0940:  Previous labs as above. Pt already tx for BV. Will tx for STD today. Dx and testing d/w pt.  Questions answered.  Verb understanding, agreeable to d/c home with outpt f/u.    Final Clinical Impressions(s) / ED Diagnoses   Final diagnoses:  None    ED Discharge Orders    None       Samuel JesterMcManus, Recia Sons, DO 06/03/18 0013

## 2018-08-22 ENCOUNTER — Emergency Department (HOSPITAL_COMMUNITY)
Admission: EM | Admit: 2018-08-22 | Discharge: 2018-08-22 | Disposition: A | Discharge status: Home / Self Care | Payer: Self-pay | Attending: Emergency Medicine | Admitting: Emergency Medicine

## 2018-08-22 ENCOUNTER — Other Ambulatory Visit: Payer: Self-pay

## 2018-08-22 ENCOUNTER — Encounter (HOSPITAL_COMMUNITY): Payer: Self-pay | Admitting: Emergency Medicine

## 2018-08-22 DIAGNOSIS — F17210 Nicotine dependence, cigarettes, uncomplicated: Secondary | ICD-10-CM | POA: Insufficient documentation

## 2018-08-22 DIAGNOSIS — R102 Pelvic and perineal pain: Secondary | ICD-10-CM | POA: Insufficient documentation

## 2018-08-22 LAB — URINALYSIS, ROUTINE W REFLEX MICROSCOPIC
Bacteria, UA: NONE SEEN
Bilirubin Urine: NEGATIVE
GLUCOSE, UA: NEGATIVE mg/dL
Hgb urine dipstick: NEGATIVE
Ketones, ur: NEGATIVE mg/dL
NITRITE: NEGATIVE
PH: 5 (ref 5.0–8.0)
Protein, ur: NEGATIVE mg/dL
SPECIFIC GRAVITY, URINE: 1.014 (ref 1.005–1.030)

## 2018-08-22 LAB — POC URINE PREG, ED: PREG TEST UR: NEGATIVE

## 2018-08-22 LAB — WET PREP, GENITAL
Clue Cells Wet Prep HPF POC: NONE SEEN
SPERM: NONE SEEN
Trich, Wet Prep: NONE SEEN
Yeast Wet Prep HPF POC: NONE SEEN

## 2018-08-22 MED ORDER — LIDOCAINE HCL (PF) 1 % IJ SOLN
INTRAMUSCULAR | Status: AC
  Administered 2018-08-22: 06:00:00
  Filled 2018-08-22: qty 2

## 2018-08-22 MED ORDER — CEFTRIAXONE SODIUM 250 MG IJ SOLR
250.0000 mg | Freq: Once | INTRAMUSCULAR | Status: AC
  Administered 2018-08-22: 250 mg via INTRAMUSCULAR
  Filled 2018-08-22: qty 250

## 2018-08-22 MED ORDER — AZITHROMYCIN 250 MG PO TABS
1000.0000 mg | ORAL_TABLET | Freq: Once | ORAL | Status: AC
  Administered 2018-08-22: 1000 mg via ORAL
  Filled 2018-08-22: qty 4

## 2018-08-22 MED ORDER — METRONIDAZOLE 500 MG PO TABS: 500.0000 mg | ORAL_TABLET | Freq: Two times a day (BID) | ORAL | 0 refills | Status: DC

## 2018-08-22 MED ORDER — IBUPROFEN 800 MG PO TABS: 800.0000 mg | ORAL_TABLET | Freq: Four times a day (QID) | ORAL | 0 refills | Status: DC | PRN

## 2018-08-22 NOTE — ED Provider Notes (Signed)
[facility] EMERGENCY DEPARTMENT Provider Note   CSN: [id number] Arrival date & time: 08/22/18  0423     History   Chief Complaint Chief Complaint  Patient presents with  . Abdominal Pain    HPI Annette Armstrong is a 32 y.o. female.  Patient presents to the ER for evaluation of abdominal and pelvic pain.  Symptoms present for 2 days.  She reports that initially she had cramping pain that was diffuse, but now is experiencing pain just in the center of her low pelvic region.  She has had vaginal discharge but no bleeding.  This is similar to symptoms that she has had when she has bacterial vaginosis.     Past Medical History:  Diagnosis Date  . Breast disorder     There are no active problems to display for this patient.   Past Surgical History:  Procedure Laterality Date  . CESAREAN SECTION       OB History    Gravida  1   Para  1   Term  1   Preterm      AB      Living        SAB      TAB      Ectopic      Multiple      Live Births               Home Medications    Prior to Admission medications   Medication Sig Start Date End Date Taking? Authorizing Provider  Boric Acid CRYS 1 capsule by Does not apply route 2 (two) times daily. Patient taking differently: 1 capsule by Does not apply route 2 (two) times daily as needed (yeast infections).  07/26/17   Eber Hong, MD  cephALEXin (KEFLEX) 500 MG capsule Take 1 capsule (500 mg total) by mouth 3 (three) times daily. Patient not taking: Reported on 05/24/2018 03/24/18   Margarita Grizzle, MD  metroNIDAZOLE (FLAGYL) 500 MG tablet Take 1 tablet (500 mg total) by mouth 2 (two) times daily. 05/24/18   Burgess Amor, PA-C  Multiple Vitamin (MULTIVITAMIN) capsule Take 1 capsule by mouth daily.    [provider]    Family History Family History  Problem Relation Age of Onset  . Hypertension Mother     Social History Social History   Tobacco Use  . Smoking status: Current Every Day Smoker      Packs/day: 0.50    Types: Cigarettes  . Smokeless tobacco: Never Used  Substance Use Topics  . Alcohol use: Yes    Comment: occ  . Drug use: Yes    Types: Marijuana    Comment: last used 05/29/18     Allergies   Coconut flavor   Review of Systems Review of Systems  Gastrointestinal: Negative for diarrhea, nausea and vomiting.  Genitourinary: Positive for pelvic pain and vaginal discharge.  All other systems reviewed and are negative.    Physical Exam Updated Vital Signs BP (!) 117/55 (BP Location: Left Arm)   Pulse 80   Temp 97.9 F (36.6 C) (Oral)   Resp 16   LMP 07/28/2018   SpO2 100%   Physical Exam  Constitutional: She is oriented to person, place, and time. She appears well-developed and well-nourished. No distress.  HENT:  Head: Normocephalic and atraumatic.  Right Ear: Hearing normal.  Left Ear: Hearing normal.  Nose: Nose normal.  Mouth/Throat: Oropharynx is clear and moist and mucous membranes are normal.  Eyes: Pupils are  equal, round, and reactive to light. Conjunctivae and EOM are normal.  Neck: Normal range of motion. Neck supple.  Cardiovascular: Regular rhythm, S1 normal and S2 normal. Exam reveals no gallop and no friction rub.  No murmur heard. Pulmonary/Chest: Effort normal and breath sounds normal. No respiratory distress. She exhibits no tenderness.  Abdominal: Soft. Normal appearance and bowel sounds are normal. There is no hepatosplenomegaly. There is no tenderness. There is no rebound, no guarding, no tenderness at McBurney's point and negative Murphy's sign. No hernia.  Genitourinary: Vagina normal and uterus normal. Cervix exhibits motion tenderness. Cervix exhibits no friability. Right adnexum displays no mass. Left adnexum displays no mass.  Musculoskeletal: Normal range of motion.  Neurological: She is alert and oriented to person, place, and time. She has normal strength. No cranial nerve deficit or sensory deficit. Coordination  normal. GCS eye subscore is 4. GCS verbal subscore is 5. GCS motor subscore is 6.  Skin: Skin is warm, dry and intact. No rash noted. No cyanosis.  Psychiatric: She has a normal mood and affect. Her speech is normal and behavior is normal. Thought content normal.  Nursing note and vitals reviewed.    ED Treatments / Results  Labs (all labs ordered are listed, but only abnormal results are displayed) Labs Reviewed  WET PREP, GENITAL - Abnormal; Notable for the following components:      Result Value   WBC, Wet Prep HPF POC FEW (*)    All other components within normal limits  URINALYSIS, ROUTINE W REFLEX MICROSCOPIC - Abnormal; Notable for the following components:   Leukocytes, UA SMALL (*)    All other components within normal limits  POC URINE PREG, ED  GC/CHLAMYDIA PROBE AMP ([place]) NOT AT [facility]    EKG None  Radiology No results found.  Procedures Procedures (including critical care time)  Medications Ordered in ED Medications - No data to display   Initial Impression / Assessment and Plan / ED Course  I have reviewed the triage vital signs and the nursing notes.  Pertinent labs & imaging results that were available during my care of the patient were reviewed by me and considered in my medical decision making (see chart for details).     Patient presents with complaints of abdominal and pelvic pain for 2 days.  She reports diffuse crampy pain initially but now is experiencing focal pain in the pelvis.  She reports a vaginal discharge which she has had previously with bacterial vaginosis.  She reports that the crampy pain she is experiencing is also similar to what she has had with BV.  She has not had urinary symptoms.  Urinalysis does not suggest infection.  Wet prep does not show clue cells or other significant abnormality other than white cells.  Pelvic exam revealed mild cervical motion tenderness without adnexal abnormality.  There was a yellowish-green  discharge present in the vaginal fornix.  GC and Chlamydia cultures are pending.  We will treat empirically, follow-up with PCP, return if symptoms worsen.  She does not have any right lower quadrant tenderness, no upper abdominal tenderness.  There is no guarding or rebound.  Doubt acute surgical process at this point, given return precautions if she has worsening abdominal pain.  Final Clinical Impressions(s) / ED Diagnoses   Final diagnoses:  Pelvic pain in female    ED Discharge Orders    None       Gilda Crease, MD 08/22/18 [phone]

## 2018-08-22 NOTE — ED Triage Notes (Signed)
Pt C/O abdominal cramping that started 2 days ago. Pt denies any N/VD. Pt states that pain is generalized. Pt denies fevers at home.

## 2018-08-23 LAB — GC/CHLAMYDIA PROBE AMP (~~LOC~~) NOT AT ARMC
Chlamydia: NEGATIVE
NEISSERIA GONORRHEA: NEGATIVE

## 2018-12-31 ENCOUNTER — Emergency Department (HOSPITAL_COMMUNITY)
Admission: EM | Admit: 2018-12-31 | Discharge: 2018-12-31 | Disposition: A | Discharge status: Home / Self Care | Payer: Self-pay | Attending: Emergency Medicine | Admitting: Emergency Medicine

## 2018-12-31 ENCOUNTER — Other Ambulatory Visit: Payer: Self-pay

## 2018-12-31 ENCOUNTER — Encounter (HOSPITAL_COMMUNITY): Payer: Self-pay

## 2018-12-31 DIAGNOSIS — Z202 Contact with and (suspected) exposure to infections with a predominantly sexual mode of transmission: Secondary | ICD-10-CM | POA: Insufficient documentation

## 2018-12-31 DIAGNOSIS — F17210 Nicotine dependence, cigarettes, uncomplicated: Secondary | ICD-10-CM | POA: Insufficient documentation

## 2018-12-31 DIAGNOSIS — N390 Urinary tract infection, site not specified: Secondary | ICD-10-CM | POA: Insufficient documentation

## 2018-12-31 DIAGNOSIS — Z79899 Other long term (current) drug therapy: Secondary | ICD-10-CM | POA: Insufficient documentation

## 2018-12-31 LAB — URINALYSIS, ROUTINE W REFLEX MICROSCOPIC
Bacteria, UA: NONE SEEN
Bilirubin Urine: NEGATIVE
Glucose, UA: NEGATIVE mg/dL
Hgb urine dipstick: NEGATIVE
Ketones, ur: NEGATIVE mg/dL
Nitrite: NEGATIVE
Protein, ur: NEGATIVE mg/dL
SPECIFIC GRAVITY, URINE: 1.015 (ref 1.005–1.030)
pH: 7 (ref 5.0–8.0)

## 2018-12-31 LAB — WET PREP, GENITAL
CLUE CELLS WET PREP: NONE SEEN
Sperm: NONE SEEN
Trich, Wet Prep: NONE SEEN
Yeast Wet Prep HPF POC: NONE SEEN

## 2018-12-31 LAB — POC URINE PREG, ED: PREG TEST UR: NEGATIVE

## 2018-12-31 MED ORDER — LIDOCAINE HCL (PF) 1 % IJ SOLN
INTRAMUSCULAR | Status: AC
  Administered 2018-12-31: 0.9 mL
  Filled 2018-12-31: qty 2

## 2018-12-31 MED ORDER — CEFDINIR 250 MG/5ML PO SUSR: 300.0000 mg | Freq: Two times a day (BID) | ORAL | 0 refills | Status: DC

## 2018-12-31 MED ORDER — AZITHROMYCIN 250 MG PO TABS
1000.0000 mg | ORAL_TABLET | Freq: Once | ORAL | Status: AC
  Administered 2018-12-31: 1000 mg via ORAL
  Filled 2018-12-31: qty 4

## 2018-12-31 MED ORDER — CEFTRIAXONE SODIUM 250 MG IJ SOLR
250.0000 mg | Freq: Once | INTRAMUSCULAR | Status: AC
  Administered 2018-12-31: 250 mg via INTRAMUSCULAR
  Filled 2018-12-31: qty 250

## 2018-12-31 NOTE — Discharge Instructions (Signed)
Your vital signs are within normal limits.  Your urine test shows urinary tract infection.  Please use omnicef 2 times daily until all taken. Please refrain from all sexual activity over the next 7 days.  Someone from the flow managers office will call you if your cultures are abnormal.  You were treated tonight with Zithromax to cover chlamydia, and Rocephin to cover gonorrhea which frequently travels with chlamydia.

## 2018-12-31 NOTE — ED Provider Notes (Signed)
[facility] EMERGENCY DEPARTMENT Provider Note   CSN: [id number] Arrival date & time: 12/31/18  1725    History   Chief Complaint No chief complaint on file.   HPI Annette Armstrong is a 33 y.o. female.     Patient is a 33 year old female who presents to the emergency department requesting STI testing.  The patient states that her sexual partner was diagnosed with chlamydia.  She says at this point she has some discharge, she has had some abdominal pain.  Patient also complains of increased urine frequency and she is concerned that she may have contracted chlamydia.  She has not had any fever, no vomiting reported.  She has had problems with chlamydia in the past.  [place]  The history is provided by the patient.    Past Medical History:  Diagnosis Date  . Breast disorder     There are no active problems to display for this patient.   Past Surgical History:  Procedure Laterality Date  . CESAREAN SECTION       OB History    Gravida  1   Para  1   Term  1   Preterm      AB      Living        SAB      TAB      Ectopic      Multiple      Live Births               Home Medications    Prior to Admission medications   Medication Sig Start Date End Date Taking? Authorizing Provider  Boric Acid CRYS 1 capsule by Does not apply route 2 (two) times daily. Patient taking differently: 1 capsule by Does not apply route 2 (two) times daily as needed (yeast infections).  07/26/17   Eber Hong, MD  cephALEXin (KEFLEX) 500 MG capsule Take 1 capsule (500 mg total) by mouth 3 (three) times daily. Patient not taking: Reported on 05/24/2018 03/24/18   Margarita Grizzle, MD  ibuprofen (ADVIL,MOTRIN) 800 MG tablet Take 1 tablet (800 mg total) by mouth every 6 (six) hours as needed for moderate pain. 08/22/18   Gilda Crease, MD  metroNIDAZOLE (FLAGYL) 500 MG tablet Take 1 tablet (500 mg total) by mouth 2  (two) times daily. One po bid x 7 days 08/22/18   Gilda Crease, MD  Multiple Vitamin (MULTIVITAMIN) capsule Take 1 capsule by mouth daily.    [provider]    Family History Family History  Problem Relation Age of Onset  . Hypertension Mother     Social History Social History   Tobacco Use  . Smoking status: Current Every Day Smoker    Packs/day: 0.50    Types: Cigarettes  . Smokeless tobacco: Never Used  Substance Use Topics  . Alcohol use: Yes    Comment: occ  . Drug use: Yes    Types: Marijuana    Comment: last used 05/29/18     Allergies   Coconut flavor   Review of Systems Review of Systems  Constitutional: Negative for activity change.       All ROS Neg except as noted in HPI  HENT: Negative for nosebleeds.   Eyes: Negative for photophobia and discharge.  Respiratory: Negative for cough, shortness of breath and wheezing.   Cardiovascular: Negative for chest pain and palpitations.  Gastrointestinal: Negative for abdominal pain  and blood in stool.  Genitourinary: Positive for frequency and vaginal discharge. Negative for dysuria and hematuria.  Musculoskeletal: Negative for arthralgias, back pain and neck pain.  Skin: Negative.   Neurological: Negative for dizziness, seizures and speech difficulty.  Psychiatric/Behavioral: Negative for confusion and hallucinations.     Physical Exam Updated Vital Signs BP 133/71 (BP Location: Right Arm)   Pulse 83   Temp 98.4 F (36.9 C) (Oral)   Resp 16   Ht 5\' 2"  (1.575 m)   LMP 12/18/2018 (Approximate)   SpO2 100%   BMI 21.95 kg/m   Physical Exam Vitals signs and nursing note reviewed. Exam conducted with a chaperone present.  Constitutional:      Appearance: She is well-developed. She is not toxic-appearing.  HENT:     Head: Normocephalic.     Right Ear: Tympanic membrane and external ear normal.     Left Ear: Tympanic membrane and external ear normal.  Eyes:     General: Lids are  normal.     Pupils: Pupils are equal, round, and reactive to light.  Neck:     Musculoskeletal: Normal range of motion and neck supple.     Vascular: No carotid bruit.  Cardiovascular:     Rate and Rhythm: Normal rate and regular rhythm.     Pulses: Normal pulses.     Heart sounds: Normal heart sounds.  Pulmonary:     Effort: No respiratory distress.     Breath sounds: Normal breath sounds.  Abdominal:     General: Bowel sounds are normal.     Palpations: Abdomen is soft.     Tenderness: There is no abdominal tenderness. There is no guarding.  Genitourinary:    Exam position: Lithotomy position.     Labia:        Right: No rash or tenderness.        Left: No rash or tenderness.      Vagina: Vaginal discharge present. No bleeding.     Cervix: Discharge, erythema and eversion present.     Adnexa:        Right: Tenderness present. No mass or fullness.         Left: No mass, tenderness or fullness.       Rectum: Normal.  Musculoskeletal: Normal range of motion.  Lymphadenopathy:     Head:     Right side of head: No submandibular adenopathy.     Left side of head: No submandibular adenopathy.     Cervical: No cervical adenopathy.     Lower Body: Right inguinal adenopathy present. Left inguinal adenopathy present.  Skin:    General: Skin is warm and dry.  Neurological:     Mental Status: She is alert and oriented to person, place, and time.     Cranial Nerves: No cranial nerve deficit.     Sensory: No sensory deficit.  Psychiatric:        Speech: Speech normal.      ED Treatments / Results  Labs (all labs ordered are listed, but only abnormal results are displayed) Labs Reviewed  URINALYSIS, ROUTINE W REFLEX MICROSCOPIC - Abnormal; Notable for the following components:      Result Value   APPearance HAZY (*)    Leukocytes,Ua MODERATE (*)    All other components within normal limits  WET PREP, GENITAL  POC URINE PREG, ED  POC URINE PREG, ED  GC/CHLAMYDIA PROBE AMP  ([place]    EKG None  Radiology No results found.  Procedures Procedures (including critical care time)  Medications Ordered in ED Medications - No data to display   Initial Impression / Assessment and Plan / ED Course  I have reviewed the triage vital signs and the nursing notes.  Pertinent labs & imaging results that were available during my care of the patient were reviewed by me and considered in my medical decision making (see chart for details).          Final Clinical Impressions(s) / ED Diagnoses MDM Vital signs are within normal limits.  Pulse oximetry is 100% on room air.  Within normal limits by my interpretation.  Patient states that her partner has been diagnosed with chlamydia, and she wants to be evaluated.  The examination reveals some yellowish discharge.  There is mild tenderness in the right adnexa. Urinalysis is consistent with a urinary tract infection. Urine pregnancy is negative.  Wet prep shows many white blood cells, otherwise negative.  The patient was treated with Zithromax and Rocephin for the exposure to chlamydia.  A culture has been sent to the lab for the abnormal urine findings.  Patient is started on Omnicef 2 times daily.  The patient is to follow-up with her primary physician if any changes in condition, problems, or concerns.    Final diagnoses:  STD exposure  Urinary tract infection without hematuria, site unspecified    ED Discharge Orders         Ordered    cefdinir (OMNICEF) 250 MG/5ML suspension  2 times daily     12/31/18 2048           Ivery Quale, PA-C 12/31/18 2112    Loren Racer, MD 01/01/19 1538

## 2018-12-31 NOTE — ED Triage Notes (Signed)
Pt states she needs an STD test because a partner of hers has chlamydia.

## 2019-01-02 LAB — URINE CULTURE
CULTURE: NO GROWTH
Special Requests: NORMAL

## 2019-01-04 LAB — GC/CHLAMYDIA PROBE AMP (~~LOC~~) NOT AT ARMC
Chlamydia: NEGATIVE
Neisseria Gonorrhea: POSITIVE — AB

## 2019-04-01 ENCOUNTER — Other Ambulatory Visit: Payer: Self-pay

## 2019-04-01 ENCOUNTER — Other Ambulatory Visit: Payer: Self-pay | Admitting: Internal Medicine

## 2019-04-01 DIAGNOSIS — Z20822 Contact with and (suspected) exposure to covid-19: Secondary | ICD-10-CM

## 2019-04-03 LAB — NOVEL CORONAVIRUS, NAA: SARS-CoV-2, NAA: NOT DETECTED

## 2019-04-05 NOTE — Congregational Nurse Program (Signed)
No complaints or concerns. Discussed and reviewed s/s of  COVID 19 and prevention.  BP 111/66  P 62, Temp [address] RN, [place], [phone]

## 2019-08-19 ENCOUNTER — Other Ambulatory Visit: Payer: Self-pay

## 2019-08-19 DIAGNOSIS — Z20822 Contact with and (suspected) exposure to covid-19: Secondary | ICD-10-CM

## 2019-08-19 NOTE — Addendum Note (Signed)
Addended by: Genella Bas R on: 08/19/2019 08:13 AM   Modules accepted: Orders

## 2019-08-20 LAB — NOVEL CORONAVIRUS, NAA: SARS-CoV-2, NAA: NOT DETECTED

## 2019-09-15 ENCOUNTER — Other Ambulatory Visit: Payer: Self-pay

## 2019-09-15 DIAGNOSIS — Z20822 Contact with and (suspected) exposure to covid-19: Secondary | ICD-10-CM

## 2019-09-17 LAB — NOVEL CORONAVIRUS, NAA: SARS-CoV-2, NAA: NOT DETECTED

## 2019-10-06 ENCOUNTER — Other Ambulatory Visit: Payer: Self-pay

## 2019-10-06 ENCOUNTER — Encounter (HOSPITAL_COMMUNITY): Payer: Self-pay | Admitting: Emergency Medicine

## 2019-10-06 ENCOUNTER — Emergency Department (HOSPITAL_COMMUNITY)
Admission: EM | Admit: 2019-10-06 | Discharge: 2019-10-06 | Disposition: A | Discharge status: Home / Self Care | Payer: Medicaid Other | Attending: Emergency Medicine | Admitting: Emergency Medicine

## 2019-10-06 DIAGNOSIS — F1210 Cannabis abuse, uncomplicated: Secondary | ICD-10-CM | POA: Insufficient documentation

## 2019-10-06 DIAGNOSIS — F17210 Nicotine dependence, cigarettes, uncomplicated: Secondary | ICD-10-CM | POA: Insufficient documentation

## 2019-10-06 DIAGNOSIS — R21 Rash and other nonspecific skin eruption: Secondary | ICD-10-CM | POA: Insufficient documentation

## 2019-10-06 DIAGNOSIS — Z79899 Other long term (current) drug therapy: Secondary | ICD-10-CM | POA: Insufficient documentation

## 2019-10-06 MED ORDER — PREDNISONE 50 MG PO TABS
60.0000 mg | ORAL_TABLET | Freq: Once | ORAL | Status: AC
  Administered 2019-10-06: 60 mg via ORAL
  Filled 2019-10-06: qty 1

## 2019-10-06 MED ORDER — PREDNISONE 20 MG PO TABS: ORAL_TABLET | ORAL | 0 refills | Status: DC

## 2019-10-06 NOTE — ED Notes (Signed)
ED Provider at bedside. 

## 2019-10-06 NOTE — ED Provider Notes (Signed)
[facility] EMERGENCY DEPARTMENT Provider Note   CSN: [id number] Arrival date & time: 10/06/19  0413     History Chief Complaint  Patient presents with  . Rash    Annette Armstrong is a 33 y.o. female.  The history is provided by the patient.  Rash Location:  Hand Hand rash location:  Dorsum of R hand and dorsum of L hand Quality: blistering   Onset quality:  Gradual Duration:  3 days Timing:  Constant Progression:  Worsening Chronicity:  New Relieved by:  None tried Worsened by:  Nothing Associated symptoms: no fever, no shortness of breath and no tongue swelling        Past Medical History:  Diagnosis Date  . Breast disorder     There are no problems to display for this patient.   Past Surgical History:  Procedure Laterality Date  . CESAREAN SECTION       OB History    Gravida  1   Para  1   Term  1   Preterm      AB      Living        SAB      TAB      Ectopic      Multiple      Live Births              Family History  Problem Relation Age of Onset  . Hypertension Mother     Social History   Tobacco Use  . Smoking status: Current Every Day Smoker    Packs/day: 0.50    Types: Cigarettes  . Smokeless tobacco: Never Used  Substance Use Topics  . Alcohol use: Yes    Comment: occ  . Drug use: Yes    Types: Marijuana    Comment: last used 05/29/18    Home Medications Prior to Admission medications   Medication Sig Start Date End Date Taking? Authorizing Provider  Boric Acid CRYS 1 capsule by Does not apply route 2 (two) times daily. Patient taking differently: 1 capsule by Does not apply route 2 (two) times daily as needed (yeast infections).  07/26/17   Noemi Chapel, MD  Multiple Vitamin (MULTIVITAMIN) capsule Take 1 capsule by mouth daily.    [provider]  predniSONE (DELTASONE) 20 MG tablet 2 tablets PO daily for 4 days 10/06/19   Ripley Fraise, MD    Allergies    Coconut flavor  Review of Systems     Review of Systems  Constitutional: Negative for fever.  Respiratory: Negative for shortness of breath.   Skin: Positive for rash.    Physical Exam Updated Vital Signs BP 124/73 (BP Location: Right Arm)   Pulse 67   Temp 98.1 F (36.7 C) (Oral)   Resp 15   LMP 08/18/2019   SpO2 100%   Physical Exam CONSTITUTIONAL: Well developed/well nourished HEAD: Normocephalic/atraumatic EYES: EOMI ENMT: no angioedema, no oral lesions NECK: supple no meningeal signs CV: S1/S2 noted  LUNGS: Lungs are clear to auscultation bilaterally, no apparent distress ABDOMEN: soft NEURO: Pt is awake/alert/appropriate, moves all extremitiesx4.  No facial droop.   EXTREMITIES: pulses normal/equal, full ROM SKIN: warm, color normal, scattered blisters noted to dorsal surface of both hands, near PIPs.  Rash spares the palms.  She is able to make a fist without difficulty.  There is no streaking.  There is mild swelling noted PSYCH: no abnormalities of mood noted, alert and oriented to situation    Patient  gave verbal permission to utilize photo for medical documentation only The image was not stored on any personal device ED Results / Procedures / Treatments   Labs (all labs ordered are listed, but only abnormal results are displayed) Labs Reviewed - No data to display  EKG None  Radiology No results found.  Procedures Procedures   Medications Ordered in ED Medications  predniSONE (DELTASONE) tablet 60 mg (60 mg Oral Given 10/06/19 0540)    ED Course  I have reviewed the triage vital signs and the nursing notes.     MDM Rules/Calculators/A&P                      Patient presents with mild rash to both hands.  She thinks it is due to the frequent use of hand sanitizer work.  She does not think it is due to jewelry.  She reports a history of eczema and thought it might be similar to that.  The rash is very subtle on her hands.  It is not on her palms.  She googled this and thought she may  have a COVID-19 infection.  She denies any cough or fever, therefore reassured her this is likely not to Covid. She does request a course of steroids and advised also to use Benadryl.  Advised to use gloves as well to limit contact of the skin of hands   Refer to dermatology Final Clinical Impression(s) / ED Diagnoses Final diagnoses:  Rash    Rx / DC Orders ED Discharge Orders         Ordered    predniSONE (DELTASONE) 20 MG tablet     10/06/19 0530           Zadie Rhine, MD 10/06/19 [phone]

## 2019-10-06 NOTE — ED Notes (Signed)
Pt reported to RN the fingers having a reaction, she thinks originates from hand sanitizer at work employees are now forced to use. Pt also reports the fingers having a reaction are the fingers she wears her rings on. Pt states she has had a reaction to fake gold earrings in the past, but the rings she wears on these fingers are 14k Gold.

## 2019-10-06 NOTE — ED Triage Notes (Signed)
Pt C/o bilateral rash on her hands. Pt unsure of what caused the rash.

## 2019-11-19 ENCOUNTER — Encounter (HOSPITAL_COMMUNITY): Payer: Self-pay | Admitting: Emergency Medicine

## 2019-11-19 ENCOUNTER — Emergency Department (HOSPITAL_COMMUNITY)
Admission: EM | Admit: 2019-11-19 | Discharge: 2019-11-19 | Disposition: A | Discharge status: Home / Self Care | Payer: Medicaid Other | Attending: Emergency Medicine | Admitting: Emergency Medicine

## 2019-11-19 ENCOUNTER — Other Ambulatory Visit: Payer: Self-pay

## 2019-11-19 DIAGNOSIS — M79645 Pain in left finger(s): Secondary | ICD-10-CM | POA: Diagnosis present

## 2019-11-19 DIAGNOSIS — F17210 Nicotine dependence, cigarettes, uncomplicated: Secondary | ICD-10-CM | POA: Insufficient documentation

## 2019-11-19 DIAGNOSIS — L089 Local infection of the skin and subcutaneous tissue, unspecified: Secondary | ICD-10-CM

## 2019-11-19 DIAGNOSIS — L03012 Cellulitis of left finger: Secondary | ICD-10-CM | POA: Insufficient documentation

## 2019-11-19 MED ORDER — LIDOCAINE HCL (PF) 2 % IJ SOLN: 10.0000 mL | Freq: Once | INTRAMUSCULAR | Status: AC

## 2019-11-19 MED ORDER — CLINDAMYCIN HCL 150 MG PO CAPS
300.0000 mg | ORAL_CAPSULE | Freq: Once | ORAL | Status: AC
  Administered 2019-11-19: 300 mg via ORAL
  Filled 2019-11-19: qty 2

## 2019-11-19 MED ORDER — LIDOCAINE HCL (PF) 2 % IJ SOLN
INTRAMUSCULAR | Status: AC
  Administered 2019-11-19: 10 mL
  Filled 2019-11-19: qty 20

## 2019-11-19 MED ORDER — CLINDAMYCIN HCL 150 MG PO CAPS: 300.0000 mg | ORAL_CAPSULE | Freq: Three times a day (TID) | ORAL | 0 refills | Status: AC

## 2019-11-19 NOTE — ED Provider Notes (Signed)
[facility] EMERGENCY DEPARTMENT Provider Note   CSN: [id number] Arrival date & time: 11/19/19  7353     History Chief Complaint  Patient presents with  . Hand Pain    Annette Armstrong is a 34 y.o. female with no significant medical history, presenting with tenderness and redness of her left distal ring finger which started after having an acrylic nail change 4 days ago.  She has been using hydrogen peroxide topically and has used some left over clindamycin 150 mg bid x 5 doses with no improvement in her symptoms.  There has been no drainage from the finger, no weakness or numbness, no fevers, chills or other complaint.   The history is provided by the patient.       Past Medical History:  Diagnosis Date  . Breast disorder     There are no problems to display for this patient.   Past Surgical History:  Procedure Laterality Date  . CESAREAN SECTION       OB History    Gravida  1   Para  1   Term  1   Preterm      AB      Living        SAB      TAB      Ectopic      Multiple      Live Births              Family History  Problem Relation Age of Onset  . Hypertension Mother     Social History   Tobacco Use  . Smoking status: Current Every Day Smoker    Packs/day: 0.50    Types: Cigarettes  . Smokeless tobacco: Never Used  Substance Use Topics  . Alcohol use: Yes    Comment: occ  . Drug use: Yes    Types: Marijuana    Comment: last used 05/29/18    Home Medications Prior to Admission medications   Medication Sig Start Date End Date Taking? Authorizing Provider  Boric Acid CRYS 1 capsule by Does not apply route 2 (two) times daily. Patient taking differently: Place 1 capsule vaginally 2 (two) times daily as needed (yeast infections).  07/26/17  Yes Eber Hong, MD  ibuprofen (ADVIL) 200 MG tablet Take 600 mg by mouth every 8 (eight) hours as needed for fever, headache or moderate pain.   Yes [provider]  Multiple Vitamin  (MULTIVITAMIN) capsule Take 1 capsule by mouth daily.   Yes [provider]  clindamycin (CLEOCIN) 150 MG capsule Take 2 capsules (300 mg total) by mouth 3 (three) times daily for 7 days. 11/19/19 11/26/19  Burgess Amor, PA-C    Allergies    Coconut flavor  Review of Systems   Review of Systems  Constitutional: Negative for chills and fever.  Respiratory: Negative for shortness of breath and wheezing.   Gastrointestinal: Negative.   Skin: Positive for color change.  Neurological: Negative for numbness.    Physical Exam Updated Vital Signs BP 121/64 (BP Location: Right Arm)   Pulse 65   Temp 98.2 F (36.8 C) (Oral)   Resp 18   Ht 5\' 6"  (1.676 m)   Wt 55 kg   SpO2 100%   BMI 19.57 kg/m   Physical Exam Vitals and nursing note reviewed.  Constitutional:      Appearance: She is well-developed.  HENT:     Head: Normocephalic and atraumatic.  Eyes:     Conjunctiva/sclera: Conjunctivae normal.  Cardiovascular:     Rate and Rhythm: Normal rate.  Pulmonary:     Effort: Pulmonary effort is normal.     Breath sounds: No wheezing.  Musculoskeletal:        General: Normal range of motion.  Skin:    General: Skin is warm and dry.     Findings: Erythema present.     Comments: Mild erythema and edema of left ring finger distal phalanx.  No paronychia, pad is soft with no induration.  Unable to visualize beneath the nail plate secondary to thick acrylic nail.  Neurological:     General: No focal deficit present.     Mental Status: She is alert.     ED Results / Procedures / Treatments   Labs (all labs ordered are listed, but only abnormal results are displayed) Labs Reviewed - No data to display  EKG None  Radiology No results found.  Procedures .Nerve Block  Date/Time: 11/19/2019 9:59 AM Performed by: Evalee Jefferson, PA-C Authorized by: Evalee Jefferson, PA-C   Consent:    Consent obtained:  Verbal   Consent given by:  Patient   Risks discussed:  Infection, nerve  damage, swelling and pain   Alternatives discussed:  No treatment Indications:    Indications:  Pain relief Location:    Body area:  Upper extremity   Upper extremity nerve blocked: ring finger - digital nerves.   Laterality:  Left Pre-procedure details:    Skin preparation:  Povidone-iodine   Preparation: Patient was prepped and draped in usual sterile fashion   Skin anesthesia (see MAR for exact dosages):    Skin anesthesia method:  None Procedure details (see MAR for exact dosages):    Block needle gauge:  25 G   Anesthetic injected:  Lidocaine 2% w/o epi   Steroid injected:  None   Additive injected:  None   Injection procedure:  Negative aspiration for blood, anatomic landmarks identified and incremental injection   Paresthesia:  Prolonged Post-procedure details:    Dressing:  Sterile dressing   Outcome:  Pain improved   (including critical care time)    Medications Ordered in ED Medications  lidocaine (XYLOCAINE) 2 % injection 10 mL (has no administration in time range)  clindamycin (CLEOCIN) capsule 300 mg (has no administration in time range)    ED Course  I have reviewed the triage vital signs and the nursing notes.  Pertinent labs & imaging results that were available during my care of the patient were reviewed by me and considered in my medical decision making (see chart for details).    MDM Rules/Calculators/A&P                      Pt with fingertip infection without abscess, felon or paronychia appreciated.  She has obtained appt with her manicurist for removal of the nail this am.  Given digital block so she can tolerate this procedure better.  Increased her abx to clindamycin 300 mg tid x 7 days.  Warm soaks.  Return precautions outlined.   Final Clinical Impression(s) / ED Diagnoses Final diagnoses:  Finger infection    Rx / DC Orders ED Discharge Orders         Ordered    clindamycin (CLEOCIN) 150 MG capsule  3 times daily     11/19/19 0943            Evalee Jefferson, PA-C 11/19/19 1002    Fredia Sorrow, MD 11/20/19 [phone]

## 2019-11-19 NOTE — Discharge Instructions (Addendum)
Take the entire course of the antibiotics prescribed.  Continue with warm water soaks twice daily for 10 minutes.  Have the nail removed until this infection is resolved.

## 2019-11-19 NOTE — ED Triage Notes (Signed)
Pt reports left ring finger pain starting Tuesday after having her artificial nails redone on Saturday.

## 2020-05-30 ENCOUNTER — Emergency Department (HOSPITAL_COMMUNITY)
Admission: EM | Admit: 2020-05-30 | Discharge: 2020-05-30 | Disposition: A | Discharge status: Home / Self Care | Payer: Medicaid Other | Attending: Emergency Medicine | Admitting: Emergency Medicine

## 2020-05-30 ENCOUNTER — Encounter (HOSPITAL_COMMUNITY): Payer: Self-pay | Admitting: Emergency Medicine

## 2020-05-30 ENCOUNTER — Other Ambulatory Visit: Payer: Self-pay

## 2020-05-30 ENCOUNTER — Emergency Department (HOSPITAL_COMMUNITY): Payer: Medicaid Other

## 2020-05-30 DIAGNOSIS — R509 Fever, unspecified: Secondary | ICD-10-CM | POA: Diagnosis present

## 2020-05-30 DIAGNOSIS — F17210 Nicotine dependence, cigarettes, uncomplicated: Secondary | ICD-10-CM | POA: Insufficient documentation

## 2020-05-30 DIAGNOSIS — U071 COVID-19: Secondary | ICD-10-CM | POA: Insufficient documentation

## 2020-05-30 LAB — SARS CORONAVIRUS 2 BY RT PCR (HOSPITAL ORDER, PERFORMED IN ~~LOC~~ HOSPITAL LAB): SARS Coronavirus 2: POSITIVE — AB

## 2020-05-30 NOTE — Discharge Instructions (Addendum)
Your test was positive for covid 19 today.   Please get a pulse oxygen sensor from the store if possible.  Please do not go into any stores as you risk spreading Covid.  If you are able to get one and noticed that your oxygen is dropping below 92, you feel significantly short of breath, noticed that your lips or nails are turning blue, have severe chest pains or other concerns please seek additional medical care and evaluation.  Please take Tylenol (acetaminophen) to relieve your pain.  You may take tylenol, up to 1,000 mg (two extra strength pills).  Do not take more than 3,000 mg tylenol in a 24 hour period.  Please check all medication labels as many medications such as pain and cold medications may contain tylenol. Please do not drink alcohol while taking this medication.

## 2020-05-30 NOTE — ED Notes (Signed)
Date and time results received: 05/30/20 2043 (use smartphrase ".now" to insert current time)  Test: covid Critical Value: positive  Name of Provider Notified: Burgess Amor  Orders Received? Or Actions Taken?: Actions Taken: no orders received

## 2020-05-30 NOTE — ED Provider Notes (Signed)
[facility] EMERGENCY DEPARTMENT Provider Note   CSN: [id number] Arrival date & time: 05/30/20  1629     History Chief Complaint  Patient presents with  . Fever    Annette Armstrong is a 34 y.o. female with no pertinent past medical history who presents today for evaluation of chills, fever, and headache for 3 days.  She reports feeling generally fatigued with myalgias/arthralgias diffusely.  She is not vaccinated against Covid.  She reports to possible exposures where she was notified that someone she was around later tested positive.  She denies any recent tick bites.  No rashes or wound.  No significant shortness of breath.    HPI     Past Medical History:  Diagnosis Date  . Breast disorder     There are no problems to display for this patient.   Past Surgical History:  Procedure Laterality Date  . CESAREAN SECTION       OB History    Gravida  1   Para  1   Term  1   Preterm      AB      Living        SAB      TAB      Ectopic      Multiple      Live Births              Family History  Problem Relation Age of Onset  . Hypertension Mother     Social History   Tobacco Use  . Smoking status: Current Every Day Smoker    Packs/day: 0.50    Types: Cigarettes  . Smokeless tobacco: Never Used  Substance Use Topics  . Alcohol use: Yes    Comment: occ  . Drug use: Yes    Types: Marijuana    Comment: last used 05/29/18    Home Medications Prior to Admission medications   Medication Sig Start Date End Date Taking? Authorizing Provider  Boric Acid CRYS 1 capsule by Does not apply route 2 (two) times daily. Patient taking differently: Place 1 capsule vaginally 2 (two) times daily as needed (yeast infections).  07/26/17   Eber Hong, MD  ibuprofen (ADVIL) 200 MG tablet Take 600 mg by mouth every 8 (eight) hours as needed for fever, headache or moderate pain.    [provider]  Multiple Vitamin (MULTIVITAMIN) capsule Take 1 capsule by  mouth daily.    [provider]    Allergies    Coconut flavor  Review of Systems   Review of Systems  Constitutional: Positive for chills, fatigue and fever.  HENT: Negative for trouble swallowing.   Respiratory: Negative for cough, chest tightness and shortness of breath.   Cardiovascular: Negative for chest pain.  Gastrointestinal: Negative for abdominal pain, diarrhea, nausea and vomiting.  Genitourinary: Negative for dysuria.  Musculoskeletal: Positive for arthralgias and myalgias.  Skin: Negative for color change and rash.  Neurological: Positive for headaches. Negative for numbness.  Psychiatric/Behavioral: Negative for confusion.  All other systems reviewed and are negative.   Physical Exam Updated Vital Signs BP (!) 117/53 (BP Location: Left Arm)   Pulse 61   Temp 98.4 F (36.9 C) (Oral)   Resp 19   Ht 5\' 1"  (1.549 m)   Wt 56.7 kg   LMP 05/24/2020   SpO2 96%   BMI 23.62 kg/m   Physical Exam Vitals and nursing note reviewed.  Constitutional:      General: She is not  in acute distress.    Appearance: She is not ill-appearing.  HENT:     Head: Normocephalic.  Eyes:     Conjunctiva/sclera: Conjunctivae normal.  Cardiovascular:     Rate and Rhythm: Normal rate.  Pulmonary:     Effort: Pulmonary effort is normal. No respiratory distress.     Breath sounds: No stridor.  Musculoskeletal:     Cervical back: Normal range of motion and neck supple.  Skin:    General: Skin is warm.  Neurological:     Mental Status: She is alert.     Comments: Awake and alert, answers all questions appropriately.  Speech is not slurred.  Psychiatric:        Mood and Affect: Mood normal.        Behavior: Behavior normal.     ED Results / Procedures / Treatments   Labs (all labs ordered are listed, but only abnormal results are displayed) Labs Reviewed  SARS CORONAVIRUS 2 BY RT PCR (HOSPITAL ORDER, PERFORMED IN [facility]) - Abnormal; Notable for the  following components:      Result Value   SARS Coronavirus 2 POSITIVE (*)    All other components within normal limits    EKG None  Radiology DG Chest Port 1 View  Result Date: 05/30/2020 CLINICAL DATA:  Chills, fever and headaches. EXAM: PORTABLE CHEST 1 VIEW COMPARISON:  None armor fifteenth 2015 FINDINGS: There is no evidence of acute infiltrate, pleural effusion or pneumothorax. The heart size and mediastinal contours are within normal limits. The visualized skeletal structures are unremarkable. IMPRESSION: No active disease. Electronically Signed   By: Aram Candela M.D.   On: 05/30/2020 19:29    Procedures Procedures (including critical care time)  Medications Ordered in ED Medications - No data to display  ED Course  I have reviewed the triage vital signs and the nursing notes.  Pertinent labs & imaging results that were available during my care of the patient were reviewed by me and considered in my medical decision making (see chart for details).    MDM Rules/Calculators/A&P                         Patient is a 34 year old woman who presents today for evaluation of COVID-19-like symptoms.  She is on vaccinated.  Symptoms primarily include headache, fever/chills and fatigue.  Her Covid test is positive.  Chest x-ray without evidence of significant infiltrates, pleural effusions or pneumothorax or other abnormalities.  I personally ambulated her in the room, she remained 96 to 100% on room air without significant symptoms.  She does not have any significant comorbidities that would qualify her for Mab infusion.  We discussed anticipated Covid course, need for isolation/quarantine. I recommended that she get a pulse oximeter to check her oxygen at home.  Return precautions were discussed with patient who states their understanding.  At the time of discharge patient denied any unaddressed complaints or concerns.  Patient is agreeable for discharge home.  Note: Portions  of this report may have been transcribed using voice recognition software. Every effort was made to ensure accuracy; however, inadvertent computerized transcription errors may be present  Annette Armstrong was evaluated in Emergency Department on 05/30/2020 for the symptoms described in the history of present illness. She was evaluated in the context of the global COVID-19 pandemic, which necessitated consideration that the patient might be at risk for infection with the SARS-CoV-2 virus that causes COVID-19. Institutional  protocols and algorithms that pertain to the evaluation of patients at risk for COVID-19 are in a state of rapid change based on information released by regulatory bodies including the CDC and federal and state organizations. These policies and algorithms were followed during the patient's care in the ED.   Final Clinical Impression(s) / ED Diagnoses Final diagnoses:  COVID-19    Rx / DC Orders ED Discharge Orders    None       Norman Clay 05/30/20 2144    Terrilee Files, MD 05/31/20 1220

## 2020-05-30 NOTE — ED Triage Notes (Signed)
Chills, fever and headache x3 days

## 2020-08-06 ENCOUNTER — Encounter (HOSPITAL_COMMUNITY): Payer: Self-pay

## 2020-08-06 ENCOUNTER — Emergency Department (HOSPITAL_COMMUNITY)
Admission: EM | Admit: 2020-08-06 | Discharge: 2020-08-06 | Disposition: A | Discharge status: Home / Self Care | Payer: Medicaid Other | Attending: Emergency Medicine | Admitting: Emergency Medicine

## 2020-08-06 ENCOUNTER — Other Ambulatory Visit: Payer: Self-pay

## 2020-08-06 DIAGNOSIS — F17210 Nicotine dependence, cigarettes, uncomplicated: Secondary | ICD-10-CM | POA: Diagnosis not present

## 2020-08-06 DIAGNOSIS — N8110 Cystocele, unspecified: Secondary | ICD-10-CM

## 2020-08-06 DIAGNOSIS — N898 Other specified noninflammatory disorders of vagina: Secondary | ICD-10-CM | POA: Diagnosis present

## 2020-08-06 DIAGNOSIS — N819 Female genital prolapse, unspecified: Secondary | ICD-10-CM | POA: Diagnosis not present

## 2020-08-06 LAB — WET PREP, GENITAL
Sperm: NONE SEEN
Trich, Wet Prep: NONE SEEN

## 2020-08-06 LAB — POC URINE PREG, ED: Preg Test, Ur: NEGATIVE

## 2020-08-06 LAB — PREGNANCY, URINE: Preg Test, Ur: NEGATIVE

## 2020-08-06 MED ORDER — METRONIDAZOLE 0.75 % VA GEL: 1.0000 | Freq: Every day | VAGINAL | 0 refills | Status: AC

## 2020-08-06 MED ORDER — FLUCONAZOLE 150 MG PO TABS: 150.0000 mg | ORAL_TABLET | Freq: Once | ORAL | 0 refills | Status: AC

## 2020-08-06 NOTE — ED Triage Notes (Signed)
Pt presents to ED with complaints of vaginal itching and white discharge x 3 days.

## 2020-08-06 NOTE — Discharge Instructions (Signed)
Your testing today shows that you probably have a yeast infection.  Taking the medication called Diflucan 1 time will usually get rid of the yeast however you may try the over-the-counter Monistat for 5 days which will also be very successful.  You also may have a bacterial infection called bacterial vaginosis.  Please check your MyChart update and if your test does show bacterial vaginosis then I would recommend that you take the MetroGel, this is applied at night in the vagina, 7 days in a row.  If your test come back showing STDs such as gonorrhea or chlamydia you will be given a phone call and medication will be called to your pharmacy.  Please be aware that on your vaginal exam it did appear that you have what is called a bladder prolapse when the bladder starts to sag into the vagina.  You may follow-up with your gynecologist as needed but at this time it is not an emergency.  At your next female appointment within the year please let them know and have them reevaluate

## 2020-08-06 NOTE — ED Provider Notes (Signed)
[facility] EMERGENCY DEPARTMENT Provider Note   CSN: [id number] Arrival date & time: 08/06/20  9470     History Chief Complaint  Patient presents with  . Vaginal Discharge    Annette Armstrong is a 34 y.o. female.  HPI   Patient is a 34 year old female, she has a history of recurrent vaginal infections including bacterial vaginitis as well as yeast infections and a history of chlamydia most recently diagnosed in July.  She presents with vaginal itching and discomfort, there is a small amount of white thick discharge that smells "yeasty".  She has no fevers, no difficulty urinating, denies being pregnant stating that she just had a menstrual cycle.  She is sexually active but has not had any new sexual partners recently.  She has tried to take some over-the-counter boric acid but has run out, she does take doxycycline rather frequently from a dermatologist for a skin condition.  Past Medical History:  Diagnosis Date  . Breast disorder     There are no problems to display for this patient.   Past Surgical History:  Procedure Laterality Date  . CESAREAN SECTION       OB History    Gravida  1   Para  1   Term  1   Preterm      AB      Living        SAB      TAB      Ectopic      Multiple      Live Births              Family History  Problem Relation Age of Onset  . Hypertension Mother     Social History   Tobacco Use  . Smoking status: Current Every Day Smoker    Packs/day: 0.50    Types: Cigarettes  . Smokeless tobacco: Never Used  Substance Use Topics  . Alcohol use: Yes    Comment: occ  . Drug use: Yes    Types: Marijuana    Comment: last used 05/29/18    Home Medications Prior to Admission medications   Medication Sig Start Date End Date Taking? Authorizing Provider  Boric Acid CRYS 1 capsule by Does not apply route 2 (two) times daily. Patient taking differently: Place 1 capsule vaginally 2 (two) times daily as needed (yeast  infections).  07/26/17   Eber Hong, MD  fluconazole (DIFLUCAN) 150 MG tablet Take 1 tablet (150 mg total) by mouth once for 1 dose. 08/06/20 08/06/20  Eber Hong, MD  ibuprofen (ADVIL) 200 MG tablet Take 600 mg by mouth every 8 (eight) hours as needed for fever, headache or moderate pain.    [provider]  metroNIDAZOLE (METROGEL VAGINAL) 0.75 % vaginal gel Place 1 Applicatorful vaginally at bedtime for 7 days. Disp QS 08/06/20 08/13/20  Eber Hong, MD  Multiple Vitamin (MULTIVITAMIN) capsule Take 1 capsule by mouth daily.    [provider]    Allergies    Coconut flavor  Review of Systems   Review of Systems  Gastrointestinal: Negative for abdominal pain, nausea and vomiting.  Genitourinary: Positive for vaginal discharge. Negative for dysuria.    Physical Exam Updated Vital Signs BP 119/60 (BP Location: Left Arm)   Pulse 71   Temp 98.5 F (36.9 C) (Oral)   Resp 18   Ht 1.549 m (5\' 1" )   Wt 57.6 kg   SpO2 100%   BMI 24.00 kg/m   Physical  Exam Vitals and nursing note reviewed.  Constitutional:      Appearance: She is well-developed. She is not diaphoretic.  HENT:     Head: Normocephalic and atraumatic.  Eyes:     General:        Right eye: No discharge.        Left eye: No discharge.     Conjunctiva/sclera: Conjunctivae normal.  Pulmonary:     Effort: Pulmonary effort is normal. No respiratory distress.  Skin:    General: Skin is warm and dry.     Findings: No erythema or rash.  Neurological:     Mental Status: She is alert.     Coordination: Coordination normal.     ED Results / Procedures / Treatments   Labs (all labs ordered are listed, but only abnormal results are displayed) Labs Reviewed  WET PREP, GENITAL  PREGNANCY, URINE  POC URINE PREG, ED  GC/CHLAMYDIA PROBE AMP ([place]) NOT AT [facility]    EKG None  Radiology No results found.  Procedures Procedures (including critical care time)  Medications Ordered in  ED Medications - No data to display  ED Course  I have reviewed the triage vital signs and the nursing notes.  Pertinent labs & imaging results that were available during my care of the patient were reviewed by me and considered in my medical decision making (see chart for details).    MDM Rules/Calculators/A&P                          This patient will need to be evaluated for possible recurrent STD versus vaginal yeast infection which is more likely given the nature of the discharge.  Vital signs are normal  Chaperone present for the vaginal exam, there does appear to be a small amount of bladder prolapse anteriorly, there is a small amount of thick white discharge with no foul odor, no tenderness, no bleeding, no foreign bodies.  Samples taken, patient treated for possible vaginal causes of vaginitis, patient agreeable to the plan, will follow up with GYN  Final Clinical Impression(s) / ED Diagnoses Final diagnoses:  Vaginal discharge  Bladder prolapse, female, acquired    Rx / DC Orders ED Discharge Orders         Ordered    fluconazole (DIFLUCAN) 150 MG tablet   Once        08/06/20 0857    metroNIDAZOLE (METROGEL VAGINAL) 0.75 % vaginal gel  Daily at bedtime        08/06/20 0857           Eber Hong, MD 08/06/20 [phone]

## 2020-08-07 LAB — GC/CHLAMYDIA PROBE AMP (~~LOC~~) NOT AT ARMC
Chlamydia: NEGATIVE
Comment: NEGATIVE
Comment: NORMAL
Neisseria Gonorrhea: NEGATIVE

## 2020-08-23 ENCOUNTER — Emergency Department (HOSPITAL_COMMUNITY)
Admission: EM | Admit: 2020-08-23 | Discharge: 2020-08-23 | Disposition: A | Discharge status: Home / Self Care | Payer: Medicaid Other | Attending: Emergency Medicine | Admitting: Emergency Medicine

## 2020-08-23 ENCOUNTER — Encounter (HOSPITAL_COMMUNITY): Payer: Self-pay

## 2020-08-23 ENCOUNTER — Other Ambulatory Visit: Payer: Self-pay

## 2020-08-23 DIAGNOSIS — R590 Localized enlarged lymph nodes: Secondary | ICD-10-CM | POA: Diagnosis not present

## 2020-08-23 DIAGNOSIS — R21 Rash and other nonspecific skin eruption: Secondary | ICD-10-CM | POA: Insufficient documentation

## 2020-08-23 DIAGNOSIS — R42 Dizziness and giddiness: Secondary | ICD-10-CM | POA: Diagnosis not present

## 2020-08-23 DIAGNOSIS — N6332 Unspecified lump in axillary tail of the left breast: Secondary | ICD-10-CM | POA: Diagnosis present

## 2020-08-23 DIAGNOSIS — F17210 Nicotine dependence, cigarettes, uncomplicated: Secondary | ICD-10-CM | POA: Insufficient documentation

## 2020-08-23 DIAGNOSIS — L80 Vitiligo: Secondary | ICD-10-CM

## 2020-08-23 LAB — CBC WITH DIFFERENTIAL/PLATELET
Abs Immature Granulocytes: 0 10*3/uL (ref 0.00–0.07)
Basophils Absolute: 0 10*3/uL (ref 0.0–0.1)
Basophils Relative: 0 %
Eosinophils Absolute: 0.3 10*3/uL (ref 0.0–0.5)
Eosinophils Relative: 7 %
HCT: 39.8 % (ref 36.0–46.0)
Hemoglobin: 12.8 g/dL (ref 12.0–15.0)
Immature Granulocytes: 0 %
Lymphocytes Relative: 40 %
Lymphs Abs: 1.9 10*3/uL (ref 0.7–4.0)
MCH: 27.8 pg (ref 26.0–34.0)
MCHC: 32.2 g/dL (ref 30.0–36.0)
MCV: 86.3 fL (ref 80.0–100.0)
Monocytes Absolute: 0.4 10*3/uL (ref 0.1–1.0)
Monocytes Relative: 10 %
Neutro Abs: 2 10*3/uL (ref 1.7–7.7)
Neutrophils Relative %: 43 %
Platelets: 216 10*3/uL (ref 150–400)
RBC: 4.61 MIL/uL (ref 3.87–5.11)
RDW: 14.1 % (ref 11.5–15.5)
WBC: 4.6 10*3/uL (ref 4.0–10.5)
nRBC: 0 % (ref 0.0–0.2)

## 2020-08-23 MED ORDER — TRIAMCINOLONE ACETONIDE 0.1 % EX CREA: 1.0000 "application " | TOPICAL_CREAM | Freq: Two times a day (BID) | CUTANEOUS | 0 refills | Status: AC

## 2020-08-23 NOTE — ED Triage Notes (Signed)
Pt reports recurrent abscesses under arms.  Reports abscess under R arm for the past week and is painful.  Reports has smaller abscesses under left arm as well.

## 2020-08-23 NOTE — ED Provider Notes (Signed)
[facility] EMERGENCY DEPARTMENT Provider Note   CSN: [id number] Arrival date & time: 08/23/20  8144     History Chief Complaint  Patient presents with  . Abscess    Annette Armstrong is a 34 y.o. female.  Patient presents with nodules under both axilla for the past week and a half.  Patient's had itching under the arm and loss of melanin.  Patient saw dermatologist however is frustrated as no improvement even though she was told unlikely to get skin color back and axilla.  No fevers or chills.  Patient's had intermittent night sweats unsure if worse than normal.  No weight loss.  No lumps or masses in the breast tissue.  No abdominal pain or fevers.  Patient denies history of known MRSA abscess.        Past Medical History:  Diagnosis Date  . Breast disorder     There are no problems to display for this patient.   Past Surgical History:  Procedure Laterality Date  . CESAREAN SECTION       OB History    Gravida  1   Para  1   Term  1   Preterm      AB      Living        SAB      TAB      Ectopic      Multiple      Live Births              Family History  Problem Relation Age of Onset  . Hypertension Mother     Social History   Tobacco Use  . Smoking status: Current Every Day Smoker    Packs/day: 0.50    Types: Cigarettes  . Smokeless tobacco: Never Used  Substance Use Topics  . Alcohol use: Yes    Comment: occ  . Drug use: Yes    Types: Marijuana    Home Medications Prior to Admission medications   Medication Sig Start Date End Date Taking? Authorizing Provider  Boric Acid CRYS 1 capsule by Does not apply route 2 (two) times daily. Patient taking differently: Place 1 capsule vaginally 2 (two) times daily as needed (yeast infections).  07/26/17   Eber Hong, MD  ibuprofen (ADVIL) 200 MG tablet Take 600 mg by mouth every 8 (eight) hours as needed for fever, headache or moderate pain.    [provider]  Multiple Vitamin  (MULTIVITAMIN) capsule Take 1 capsule by mouth daily.    [provider]    Allergies    Coconut flavor  Review of Systems   Review of Systems  Constitutional: Negative for chills and fever.  HENT: Negative for congestion.   Eyes: Negative for visual disturbance.  Respiratory: Negative for shortness of breath.   Cardiovascular: Negative for chest pain.  Gastrointestinal: Negative for abdominal pain and vomiting.  Genitourinary: Negative for dysuria and flank pain.  Musculoskeletal: Negative for back pain, neck pain and neck stiffness.  Skin: Positive for rash.  Neurological: Negative for light-headedness and headaches.    Physical Exam Updated Vital Signs BP 115/73   Pulse 72   Resp 14   Ht 5\' 1"  (1.549 m)   Wt 57.6 kg   LMP 08/14/2020   SpO2 100%   BMI 24.00 kg/m   Physical Exam Vitals and nursing note reviewed.  Constitutional:      Appearance: She is well-developed.  HENT:     Head: Normocephalic and atraumatic.  Eyes:     General:        Right eye: No discharge.        Left eye: No discharge.     Conjunctiva/sclera: Conjunctivae normal.  Neck:     Trachea: No tracheal deviation.  Cardiovascular:     Rate and Rhythm: Normal rate.  Pulmonary:     Effort: Pulmonary effort is normal.  Musculoskeletal:     Cervical back: Normal range of motion and neck supple.  Skin:    General: Skin is warm.     Findings: Rash present.     Comments: Patient has vitiligo patches both axilla with mild excoriation and a few minimally tender lymph nodes, no induration or abscess appreciated.  Neurological:     Mental Status: She is alert and oriented to person, place, and time.     ED Results / Procedures / Treatments   Labs (all labs ordered are listed, but only abnormal results are displayed) Labs Reviewed  CBC WITH DIFFERENTIAL/PLATELET    EKG None  Radiology No results found.  Procedures Procedures (including critical care time)  Medications Ordered  in ED Medications - No data to display  ED Course  I have reviewed the triage vital signs and the nursing notes.  Pertinent labs & imaging results that were available during my care of the patient were reviewed by me and considered in my medical decision making (see chart for details).    MDM Rules/Calculators/A&P                          Patient presents with lymphadenopathy and vitiligo.  Discussed outpatient mammogram for completeness with lymph nodes and plan for CBC with differential.  We will try topical steroids to see if it helps with her symptoms.  Patient has follow-up with dermatology.  CBC reviewed normal wbc and normal hb.  Final Clinical Impression(s) / ED Diagnoses Final diagnoses:  Vitiligo  Axillary lymphadenopathy    Rx / DC Orders ED Discharge Orders    None       Blane Ohara, MD 08/23/20 [id number]

## 2020-08-23 NOTE — Discharge Instructions (Addendum)
Try topical cream twice daily for 1 week.   Your blood work was normal.   Follow-up with your dermatologist. Discuss mammogram with your doctor. Use Benadryl as needed for itching.

## 2021-05-16 ENCOUNTER — Other Ambulatory Visit (HOSPITAL_COMMUNITY): Payer: Self-pay

## 2021-05-16 ENCOUNTER — Other Ambulatory Visit (HOSPITAL_COMMUNITY): Payer: Self-pay | Admitting: Nurse Practitioner

## 2021-05-16 DIAGNOSIS — N6310 Unspecified lump in the right breast, unspecified quadrant: Secondary | ICD-10-CM

## 2021-05-16 DIAGNOSIS — N63 Unspecified lump in unspecified breast: Secondary | ICD-10-CM

## 2021-06-06 ENCOUNTER — Ambulatory Visit (HOSPITAL_COMMUNITY)
Admission: RE | Admit: 2021-06-06 | Discharge: 2021-06-06 | Disposition: A | Discharge status: Home / Self Care | Payer: Medicaid Other | Source: Ambulatory Visit | Attending: Nurse Practitioner | Admitting: Nurse Practitioner

## 2021-06-06 ENCOUNTER — Other Ambulatory Visit: Payer: Self-pay

## 2021-06-06 DIAGNOSIS — N6310 Unspecified lump in the right breast, unspecified quadrant: Secondary | ICD-10-CM | POA: Insufficient documentation

## 2021-06-06 DIAGNOSIS — Z1231 Encounter for screening mammogram for malignant neoplasm of breast: Secondary | ICD-10-CM | POA: Insufficient documentation

## 2021-06-11 ENCOUNTER — Other Ambulatory Visit (HOSPITAL_COMMUNITY): Payer: Self-pay | Admitting: Nurse Practitioner

## 2022-05-03 ENCOUNTER — Emergency Department (HOSPITAL_COMMUNITY): Payer: Medicaid Other

## 2022-05-03 ENCOUNTER — Encounter (HOSPITAL_COMMUNITY): Payer: Self-pay

## 2022-05-03 ENCOUNTER — Other Ambulatory Visit: Payer: Self-pay

## 2022-05-03 ENCOUNTER — Emergency Department (HOSPITAL_COMMUNITY)
Admission: EM | Admit: 2022-05-03 | Discharge: 2022-05-03 | Disposition: A | Discharge status: Home / Self Care | Payer: Self-pay | Attending: Emergency Medicine | Admitting: Emergency Medicine

## 2022-05-03 DIAGNOSIS — J069 Acute upper respiratory infection, unspecified: Secondary | ICD-10-CM | POA: Insufficient documentation

## 2022-05-03 DIAGNOSIS — R0602 Shortness of breath: Secondary | ICD-10-CM | POA: Insufficient documentation

## 2022-05-03 DIAGNOSIS — Z20822 Contact with and (suspected) exposure to covid-19: Secondary | ICD-10-CM | POA: Insufficient documentation

## 2022-05-03 DIAGNOSIS — R079 Chest pain, unspecified: Secondary | ICD-10-CM | POA: Insufficient documentation

## 2022-05-03 LAB — RESP PANEL BY RT-PCR (FLU A&B, COVID) ARPGX2
Influenza A by PCR: NEGATIVE
Influenza B by PCR: NEGATIVE
SARS Coronavirus 2 by RT PCR: NEGATIVE

## 2022-05-03 MED ORDER — ALBUTEROL SULFATE HFA 108 (90 BASE) MCG/ACT IN AERS
2.0000 | INHALATION_SPRAY | Freq: Once | RESPIRATORY_TRACT | Status: AC
  Administered 2022-05-03: 2 via RESPIRATORY_TRACT
  Filled 2022-05-03: qty 6.7

## 2022-05-03 MED ORDER — BENZONATATE 100 MG PO CAPS: 100.0000 mg | ORAL_CAPSULE | Freq: Three times a day (TID) | ORAL | 0 refills | Status: AC | PRN

## 2022-05-03 NOTE — ED Triage Notes (Signed)
Patient present to ED, c/o productive cough with greenish phlegm, reports short of breath on exertion during the day. During the night she states her cough is non-productive and dry that keeps her up at night.

## 2022-05-03 NOTE — Discharge Instructions (Addendum)
You may use the albuterol inhaler 1-2 puffs every 4 hours as needed for cough, shortness of breath, wheezing.  If you develop high fever, severe cough or cough with blood, trouble breathing, severe headache, neck pain/stiffness, vomiting, or any other new/concerning symptoms then return to the ER for evaluation

## 2022-05-03 NOTE — ED Provider Notes (Signed)
[facility] EMERGENCY DEPARTMENT Provider Note   CSN: [id number] Arrival date & time: 05/03/22  1031     History  Chief Complaint  Patient presents with   Cough    Annette Armstrong is a 36 y.o. female.  HPI 36 year old female presents with a chief complaint of cough.  She has been having cough and sore throat for about 1 week.  After taking Mucinex her cough is turned productive.  It is now productive of green sputum.  Also having nasal congestion.  No fevers.  She gets short of breath at night when the cough is worse.  Some chest pain with coughing.  She has also tried TheraFlu and tried her mom's inhaler which seemed to help a little bit.  Home Medications Prior to Admission medications   Medication Sig Start Date End Date Taking? Authorizing Provider  benzonatate (TESSALON) 100 MG capsule Take 1 capsule (100 mg total) by mouth 3 (three) times daily as needed for cough. 05/03/22  Yes Pricilla Loveless, MD  Boric Acid CRYS 1 capsule by Does not apply route 2 (two) times daily. Patient taking differently: Place 1 capsule vaginally 2 (two) times daily as needed (yeast infections).  07/26/17   Eber Hong, MD  ibuprofen (ADVIL) 200 MG tablet Take 600 mg by mouth every 8 (eight) hours as needed for fever, headache or moderate pain.    [provider]  Multiple Vitamin (MULTIVITAMIN) capsule Take 1 capsule by mouth daily.    [provider]      Allergies    Coconut flavor    Review of Systems   Review of Systems  Constitutional:  Negative for fever.  HENT:  Positive for congestion, postnasal drip and sore throat.   Respiratory:  Positive for cough and shortness of breath.   Cardiovascular:  Positive for chest pain.    Physical Exam Updated Vital Signs BP 101/61   Pulse 89   Temp 98.3 F (36.8 C) (Oral)   Resp 18   Ht 5\' 2"  (1.575 m)   Wt 58.1 kg   LMP 05/02/2022   SpO2 99%   BMI 23.41 kg/m  Physical Exam Vitals and nursing note reviewed.   Constitutional:      General: She is not in acute distress.    Appearance: She is well-developed. She is not ill-appearing or diaphoretic.  HENT:     Head: Normocephalic and atraumatic.     Mouth/Throat:     Pharynx: Oropharynx is clear. No oropharyngeal exudate or posterior oropharyngeal erythema.  Cardiovascular:     Rate and Rhythm: Normal rate and regular rhythm.     Heart sounds: Normal heart sounds.  Pulmonary:     Effort: Pulmonary effort is normal.     Breath sounds: Normal breath sounds. No wheezing or rales.  Abdominal:     General: There is no distension.     Palpations: Abdomen is soft.     Tenderness: There is no abdominal tenderness.  Skin:    General: Skin is warm and dry.  Neurological:     Mental Status: She is alert.     ED Results / Procedures / Treatments   Labs (all labs ordered are listed, but only abnormal results are displayed) Labs Reviewed  RESP PANEL BY RT-PCR (FLU A&B, COVID) [id number]    EKG None  Radiology DG Chest 2 View  Result Date: 05/03/2022 CLINICAL DATA:  1 week history of cough. EXAM: CHEST - 2 VIEW COMPARISON:  05/30/2020 FINDINGS: The heart  size and mediastinal contours are within normal limits. Both lungs are clear. The visualized skeletal structures are unremarkable. IMPRESSION: No active cardiopulmonary disease. Electronically Signed   By: Kennith Center M.D.   On: 05/03/2022 11:34    Procedures Procedures    Medications Ordered in ED Medications  albuterol (VENTOLIN HFA) 108 (90 Base) MCG/ACT inhaler 2 puff (2 puffs Inhalation Given 05/03/22 1228)    ED Course/ Medical Decision Making/ A&P                           Medical Decision Making Amount and/or Complexity of Data Reviewed Radiology: ordered.  Risk Prescription drug management.   Chest x-ray shows no pneumonia.  Personally viewed/interpreted these images.  No overt wheezing.  COVID/flu testing negative.  I suspect this is a viral URI.  Discussed this can  linger.  She would like an albuterol inhaler as this seemed to help earlier.  I do not hear wheezing.  At this point I do not see any indication for antibiotics and we discussed this.  She will also be given Tessalon Perles.  Discharged home with return precautions.        Final Clinical Impression(s) / ED Diagnoses Final diagnoses:  Viral upper respiratory infection    Rx / DC Orders ED Discharge Orders          Ordered    benzonatate (TESSALON) 100 MG capsule  3 times daily PRN        05/03/22 1221              Pricilla Loveless, MD 05/03/22 1555

## 2022-07-29 ENCOUNTER — Encounter (HOSPITAL_COMMUNITY): Payer: Self-pay

## 2022-07-29 ENCOUNTER — Other Ambulatory Visit: Payer: Self-pay

## 2022-07-29 ENCOUNTER — Emergency Department (HOSPITAL_COMMUNITY)
Admission: EM | Admit: 2022-07-29 | Discharge: 2022-07-29 | Disposition: A | Discharge status: Home / Self Care | Payer: Medicaid Other | Attending: Emergency Medicine | Admitting: Emergency Medicine

## 2022-07-29 DIAGNOSIS — R590 Localized enlarged lymph nodes: Secondary | ICD-10-CM | POA: Insufficient documentation

## 2022-07-29 DIAGNOSIS — F17210 Nicotine dependence, cigarettes, uncomplicated: Secondary | ICD-10-CM | POA: Insufficient documentation

## 2022-07-29 LAB — URINALYSIS, ROUTINE W REFLEX MICROSCOPIC
Bilirubin Urine: NEGATIVE
Glucose, UA: NEGATIVE mg/dL
Hgb urine dipstick: NEGATIVE
Ketones, ur: NEGATIVE mg/dL
Leukocytes,Ua: NEGATIVE
Nitrite: NEGATIVE
Protein, ur: NEGATIVE mg/dL
Specific Gravity, Urine: 1.023 (ref 1.005–1.030)
pH: 5 (ref 5.0–8.0)

## 2022-07-29 LAB — CBG MONITORING, ED: Glucose-Capillary: 77 mg/dL (ref 70–99)

## 2022-07-29 MED ORDER — CEPHALEXIN 500 MG PO CAPS: 500.0000 mg | ORAL_CAPSULE | Freq: Three times a day (TID) | ORAL | 0 refills | Status: AC

## 2022-07-29 NOTE — Discharge Instructions (Signed)
You were evaluated in the Emergency Department and after careful evaluation, we did not find any emergent condition requiring admission or further testing in the hospital.  Your exam/testing today is overall reassuring.  If you continue to have a swollen lymph node after 3-5 more days, recommend taking the Keflex antibiotic as directed.  If you continue to have a swollen lymph node for 1 or 2 more weeks, recommend evaluation by your primary care doctor.  Please return to the Emergency Department if you experience any worsening of your condition.   Thank you for allowing [place] to be a part of your care.

## 2022-07-29 NOTE — ED Provider Notes (Signed)
[facility] Emergency Department Provider Note MRN:  [id number]  Arrival date & time: 07/29/22     Chief Complaint   Lymph node History of Present Illness   Annette Armstrong is a 36 y.o. year-old female with no pertinent past medical history presenting to the ED with chief complaint of lymph node.  Swollen lymph node on the left side of the neck, has been there for a few days.  Had some ear discomfort earlier in the week which is now resolved, this was on the same side.  No recent fever or cough or illness, no night sweats, no unintentional weight loss.  Review of Systems  A thorough review of systems was obtained and all systems are negative except as noted in the HPI and PMH.   Patient's Health History    Past Medical History:  Diagnosis Date   Breast disorder     Past Surgical History:  Procedure Laterality Date   CESAREAN SECTION      Family History  Problem Relation Age of Onset   Hypertension Mother     Social History   Socioeconomic History   Marital status: Single    Spouse name: Not on file   Number of children: Not on file   Years of education: Not on file   Highest education level: Not on file  Occupational History   Not on file  Tobacco Use   Smoking status: Every Day    Packs/day: 0.50    Types: Cigarettes   Smokeless tobacco: Never  Substance and Sexual Activity   Alcohol use: Yes    Comment: occ   Drug use: Yes    Types: Marijuana   Sexual activity: Yes    Birth control/protection: None  Other Topics Concern   Not on file  Social History Narrative   Not on file   Social Determinants of Health   Financial Resource Strain: Not on file  Food Insecurity: Not on file  Transportation Needs: Not on file  Physical Activity: Not on file  Stress: Not on file  Social Connections: Not on file  Intimate Partner Violence: Not on file     Physical Exam   Vitals:   07/29/22 0525 07/29/22 0528  BP: (!) 107/41   Pulse: 75 63   Resp: 15   Temp:  98 F (36.7 C)  SpO2: 100% 100%    CONSTITUTIONAL: Well-appearing, NAD NEURO/PSYCH:  Alert and oriented x 3, no focal deficits EYES:  eyes equal and reactive ENT/NECK:  no LAD, no JVD CARDIO: Regular rate, well-perfused, normal S1 and S2 PULM:  CTAB no wheezing or rhonchi GI/GU:  non-distended, non-tender MSK/SPINE:  No gross deformities, no edema SKIN:  no rash, atraumatic   *Additional and/or pertinent findings included in MDM below  Diagnostic and Interventional Summary    EKG Interpretation  Date/Time:    Ventricular Rate:    PR Interval:    QRS Duration:   QT Interval:    QTC Calculation:   R Axis:     Text Interpretation:         Labs Reviewed  URINALYSIS, ROUTINE W REFLEX MICROSCOPIC  CBG MONITORING, ED    No orders to display    Medications - No data to display   Procedures  /  Critical Care Procedures  ED Course and Medical Decision Making  Initial Impression and Ddx About 1 cm palpable reactive lymph node to the left lateral neck, not particularly tender.  Could be reactive versus  less likely adenitis versus less likely Hodgkin's lymphoma.  Patient also complaining of frequent urination, will screen with CBG, urinalysis.  Overall provided reassurance, advised close follow-up.  Past medical/surgical history that increases complexity of ED encounter: None  Interpretation of Diagnostics UA normal, CBG reassuring  Patient Reassessment and Ultimate Disposition/Management     Discharge  Patient management required discussion with the following services or consulting groups:  None  Complexity of Problems Addressed Acute illness or injury that poses threat of life of bodily function  Additional Data Reviewed and Analyzed Further history obtained from: None  Additional Factors Impacting ED Encounter Risk Prescriptions  Barth Kirks. Sedonia Small, MD [place] [email or web address]  Final Clinical Impressions(s) / ED Diagnoses     ICD-10-CM   1. Lymphadenopathy, cervical  R59.0       ED Discharge Orders          Ordered    cephALEXin (KEFLEX) 500 MG capsule  3 times daily        07/29/22 [id number]             Discharge Instructions Discussed with and Provided to Patient:     Discharge Instructions      You were evaluated in the Emergency Department and after careful evaluation, we did not find any emergent condition requiring admission or further testing in the hospital.  Your exam/testing today is overall reassuring.  If you continue to have a swollen lymph node after 3-5 more days, recommend taking the Keflex antibiotic as directed.  If you continue to have a swollen lymph node for 1 or 2 more weeks, recommend evaluation by your primary care doctor.  Please return to the Emergency Department if you experience any worsening of your condition.   Thank you for allowing [place] to be a part of your care.       Maudie Flakes, MD 07/29/22 [phone]

## 2022-07-29 NOTE — ED Triage Notes (Addendum)
Pt c/o swollen lymph node on left side of neck. States she had an ear ache approx. 1 week ago, that has since resolved. Now only having pain on neck.  Denies any other symptoms.

## 2022-08-13 ENCOUNTER — Telehealth: Payer: Self-pay | Admitting: Physician Assistant

## 2022-08-13 DIAGNOSIS — J029 Acute pharyngitis, unspecified: Secondary | ICD-10-CM

## 2022-08-13 NOTE — Progress Notes (Signed)
E-Visit for Sore Throat  We are sorry that you are not feeling well.  Here is how we plan to help!  Your symptoms indicate a likely viral infection (Pharyngitis).   Pharyngitis is inflammation in the back of the throat which can cause a sore throat, scratchiness and sometimes difficulty swallowing.   Pharyngitis is typically caused by a respiratory virus and will just run its course.  Please keep in mind that your symptoms could last up to 10 days.  For throat pain, we recommend over the counter oral pain relief medications such as acetaminophen or aspirin, or anti-inflammatory medications such as ibuprofen or naproxen sodium.  Topical treatments such as oral throat lozenges or sprays may be used as needed.  Avoid close contact with loved ones, especially the very young and elderly.  Remember to wash your hands thoroughly throughout the day as this is the number one way to prevent the spread of infection and wipe down door knobs and counters with disinfectant.  After careful review of your answers, I would not recommend an antibiotic for your condition.  Antibiotics should not be used to treat conditions that we suspect are caused by viruses like the virus that causes the common cold or flu. However, some people can have Strep with atypical symptoms. You may need formal testing in clinic or office to confirm if your symptoms continue or worsen.  Providers prescribe antibiotics to treat infections caused by bacteria. Antibiotics are very powerful in treating bacterial infections when they are used properly.  To maintain their effectiveness, they should be used only when necessary.  Overuse of antibiotics has resulted in the development of super bugs that are resistant to treatment!    Home Care: Only take medications as instructed by your medical team. Do not drink alcohol while taking these medications. A steam or ultrasonic humidifier can help congestion.  You can place a towel over your head and  breathe in the steam from hot water coming from a faucet. Avoid close contacts especially the very young and the elderly. Cover your mouth when you cough or sneeze. Always remember to wash your hands.  Get Help Right Away If: You develop worsening fever or throat pain. You develop a severe head ache or visual changes. Your symptoms persist after you have completed your treatment plan.  Make sure you Understand these instructions. Will watch your condition. Will get help right away if you are not doing well or get worse.   Thank you for choosing an e-visit.  Your e-visit answers were reviewed by a board certified advanced clinical practitioner to complete your personal care plan. Depending upon the condition, your plan could have included both over the counter or prescription medications.  Please review your pharmacy choice. Make sure the pharmacy is open so you can pick up prescription now. If there is a problem, you may contact your provider through MyChart messaging and have the prescription routed to another pharmacy.  Your safety is important to us. If you have drug allergies check your prescription carefully.   For the next 24 hours you can use MyChart to ask questions about today's visit, request a non-urgent call back, or ask for a work or school excuse. You will get an email in the next two days asking about your experience. I hope that your e-visit has been valuable and will speed your recovery.  I have spent 5 minutes in review of e-visit questionnaire, review and updating patient chart, medical decision making and response   to patient.   Siyon Linck M Abdoulie Tierce, PA-C  

## 2022-10-01 ENCOUNTER — Emergency Department (HOSPITAL_COMMUNITY)
Admission: EM | Admit: 2022-10-01 | Discharge: 2022-10-01 | Disposition: A | Discharge status: Home / Self Care | Payer: PRIVATE HEALTH INSURANCE | Attending: Emergency Medicine | Admitting: Emergency Medicine

## 2022-10-01 ENCOUNTER — Other Ambulatory Visit: Payer: Self-pay

## 2022-10-01 DIAGNOSIS — Z20822 Contact with and (suspected) exposure to covid-19: Secondary | ICD-10-CM | POA: Diagnosis not present

## 2022-10-01 DIAGNOSIS — J101 Influenza due to other identified influenza virus with other respiratory manifestations: Secondary | ICD-10-CM | POA: Insufficient documentation

## 2022-10-01 DIAGNOSIS — R059 Cough, unspecified: Secondary | ICD-10-CM | POA: Diagnosis present

## 2022-10-01 LAB — RESP PANEL BY RT-PCR (RSV, FLU A&B, COVID)  RVPGX2
Influenza A by PCR: POSITIVE — AB
Influenza B by PCR: NEGATIVE
Resp Syncytial Virus by PCR: NEGATIVE
SARS Coronavirus 2 by RT PCR: NEGATIVE

## 2022-10-01 MED ORDER — OSELTAMIVIR PHOSPHATE 75 MG PO CAPS: 75.0000 mg | ORAL_CAPSULE | Freq: Two times a day (BID) | ORAL | 0 refills | Status: AC

## 2022-10-01 NOTE — Discharge Instructions (Signed)
Begin taking Tamiflu as prescribed.  Take over-the-counter medications as needed for relief of symptoms.  Follow-up with primary doctor if not improving in the next few days.

## 2022-10-01 NOTE — ED Provider Notes (Signed)
[facility] EMERGENCY DEPARTMENT Provider Note   CSN: [id number] Arrival date & time: 10/01/22  5784     History  Chief Complaint  Patient presents with   Cough    Annette Armstrong is a 36 y.o. female.  Patient is a 36 year old female with no significant past medical history.  She presents with a 2-day history of bodyaches, cough, and congestion.  She reports her son at home was ill recently, but was not seen by a physician.  She denies fevers or chills.  She denies vomiting or diarrhea.  No chest pain or shortness of breath.  Cough is nonproductive.  She denies any aggravating or alleviating factors.  The history is provided by the patient.       Home Medications Prior to Admission medications   Medication Sig Start Date End Date Taking? Authorizing Provider  benzonatate (TESSALON) 100 MG capsule Take 1 capsule (100 mg total) by mouth 3 (three) times daily as needed for cough. 05/03/22   Pricilla Loveless, MD  ibuprofen (ADVIL) 200 MG tablet Take 600 mg by mouth every 8 (eight) hours as needed for fever, headache or moderate pain.    [provider]  Multiple Vitamin (MULTIVITAMIN) capsule Take 1 capsule by mouth daily.    [provider]      Allergies    Coconut flavor    Review of Systems   Review of Systems  All other systems reviewed and are negative.   Physical Exam Updated Vital Signs BP (!) 118/52 (BP Location: Left Arm)   Pulse 86   Temp 98.4 F (36.9 C) (Oral)   Resp 17   Ht 5\' 1"  (1.549 m)   Wt 58.1 kg   LMP 09/28/2022 (Approximate)   SpO2 98%   BMI 24.19 kg/m  Physical Exam Vitals and nursing note reviewed.  Constitutional:      General: She is not in acute distress.    Appearance: She is well-developed. She is not diaphoretic.  HENT:     Head: Normocephalic and atraumatic.  Cardiovascular:     Rate and Rhythm: Normal rate and regular rhythm.     Heart sounds: No murmur heard.    No friction rub. No gallop.  Pulmonary:      Effort: Pulmonary effort is normal. No respiratory distress.     Breath sounds: Normal breath sounds. No wheezing.  Abdominal:     General: Bowel sounds are normal. There is no distension.     Palpations: Abdomen is soft.     Tenderness: There is no abdominal tenderness.  Musculoskeletal:        General: Normal range of motion.     Cervical back: Normal range of motion and neck supple.  Skin:    General: Skin is warm and dry.  Neurological:     General: No focal deficit present.     Mental Status: She is alert and oriented to person, place, and time.     ED Results / Procedures / Treatments   Labs (all labs ordered are listed, but only abnormal results are displayed) Labs Reviewed  RESP PANEL BY RT-PCR (RSV, FLU A&B, COVID)  [id number]    EKG None  Radiology No results found.  Procedures Procedures    Medications Ordered in ED Medications - No data to display  ED Course/ Medical Decision Making/ A&P  Influenza test is positive for influenza A.  Patient to be discharged with Tamiflu and over-the-counter medications as needed.  Final Clinical Impression(s) /  ED Diagnoses Final diagnoses:  None    Rx / DC Orders ED Discharge Orders     None         Geoffery Lyons, MD 10/01/22 [phone]

## 2022-10-01 NOTE — ED Triage Notes (Signed)
Pt c/o cough and generalized body aches x2 days

## 2023-04-24 ENCOUNTER — Telehealth: Payer: Self-pay

## 2023-04-24 NOTE — Telephone Encounter (Addendum)
ED Follow Up Outreach  Initial Outreach Documentation   What specific health condition(s) do you have that needs to be monitored by a doctor? None   What are you currently doing to manage your health condition(s)? N/A  Some of the symptoms that were reported during your emergency room visits could be managed by a primary care provider. When considering your emergency room visits, did you call your primary care doctor to report your symptoms or illness before going to the emergency room?  Patient reports I haven;t been to the ED for a long time, I go there in necessary needs  Do you have access to resources or alternative options to help you avoid going to the ED unless is an emergency? If not, what resources or alternative options do you feel you need?    Is there something that you would like to learn more about: Stress Management Anxiety management Activity Tracking Mood/Depression Monitoring blood pressure/ weight /diet Physical activity / Exercise  PT WOULD LIKE INFORMATION ON QUITTING SMOKING   When is a good time to call you back in the next 2 weeks? During this call we will review the resources you need and discuss when to call your primary care doctor for health issues.Yes, pt agree to follow-up call in 2 weeks.

## 2023-05-18 IMAGING — MG DIGITAL DIAGNOSTIC BILAT W/ TOMO W/ CAD
6 of 12 series · 6 of 36 positions shown · non-contrast
Comparison: RIGHT breast ultrasound 08/27/2013

CLINICAL DATA: Palpable RIGHT breast mass, first noted when she was
16 years old.

EXAM:
DIGITAL DIAGNOSTIC BILATERAL MAMMOGRAM WITH TOMOSYNTHESIS AND CAD;
ULTRASOUND RIGHT BREAST LIMITED
TECHNIQUE: Bilateral digital diagnostic mammography and breast tomosynthesis
was performed. The images were evaluated with computer-aided
detection.; Targeted ultrasound examination of the right breast was
performed

[R MLO synth-2D (1 of 2)]
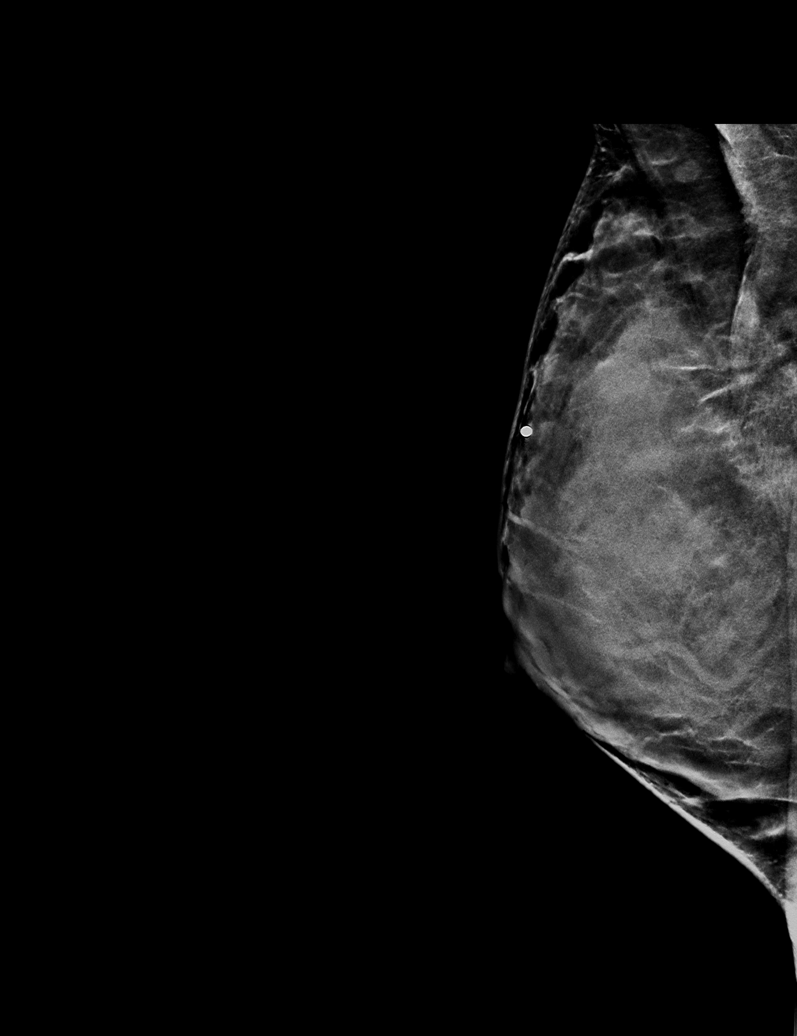

[R TAN synth-2D]
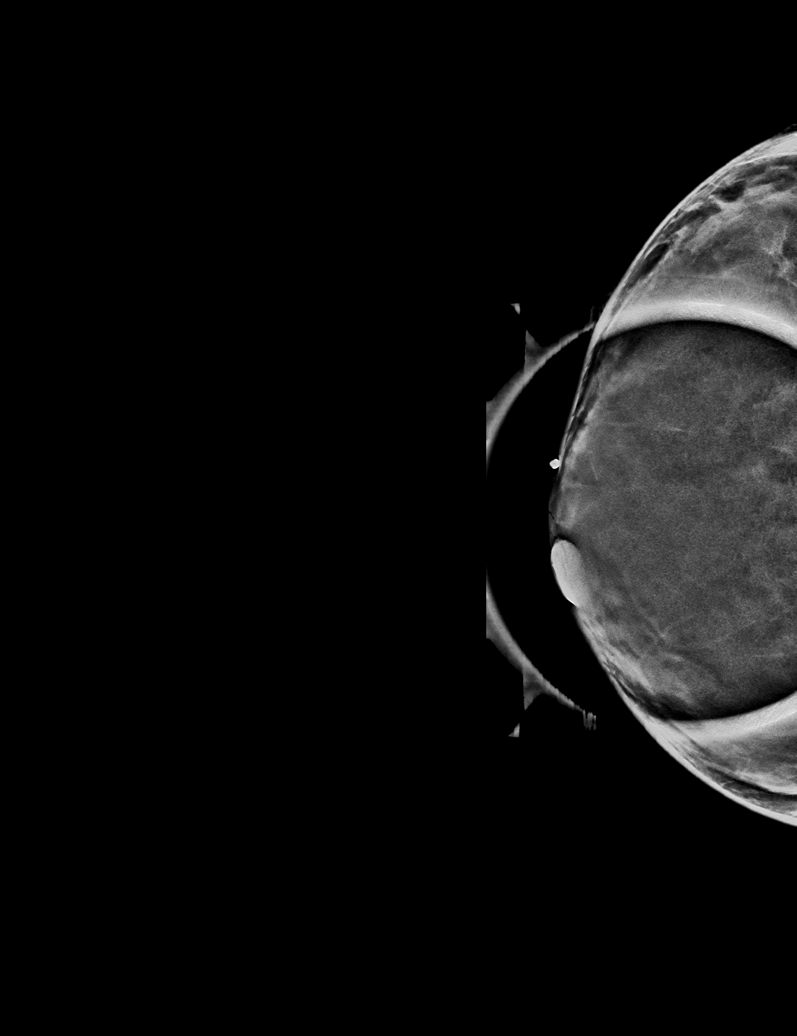

[L CC synth-2D]
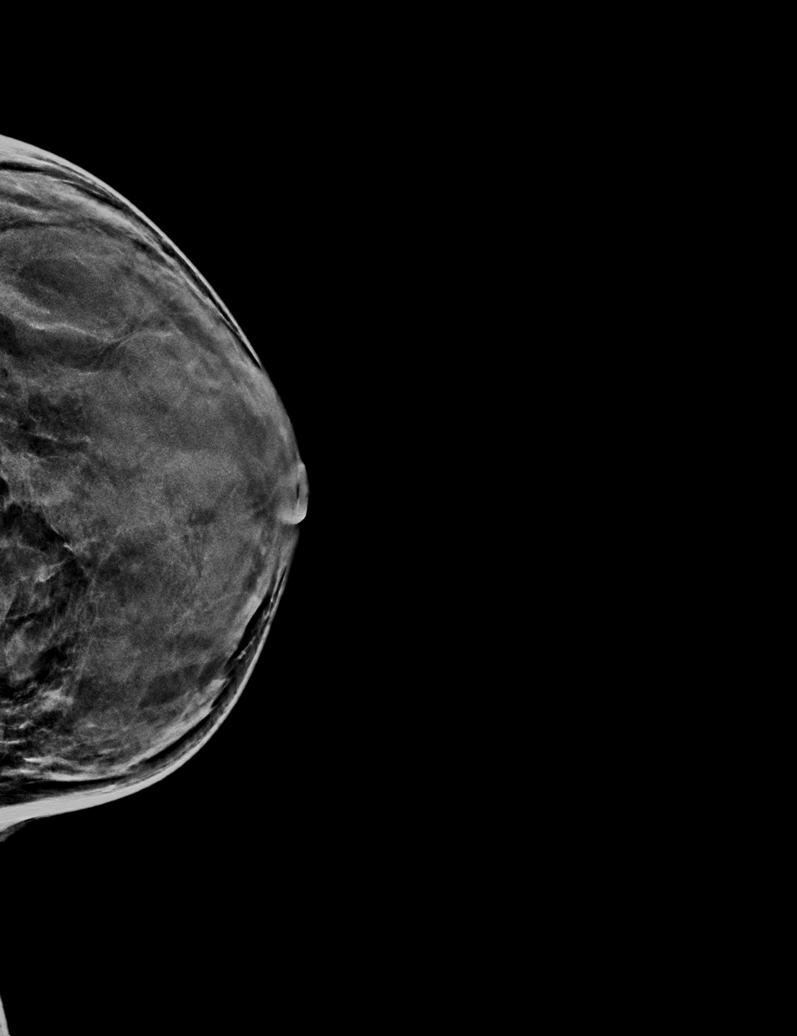

[R CC synth-2D]
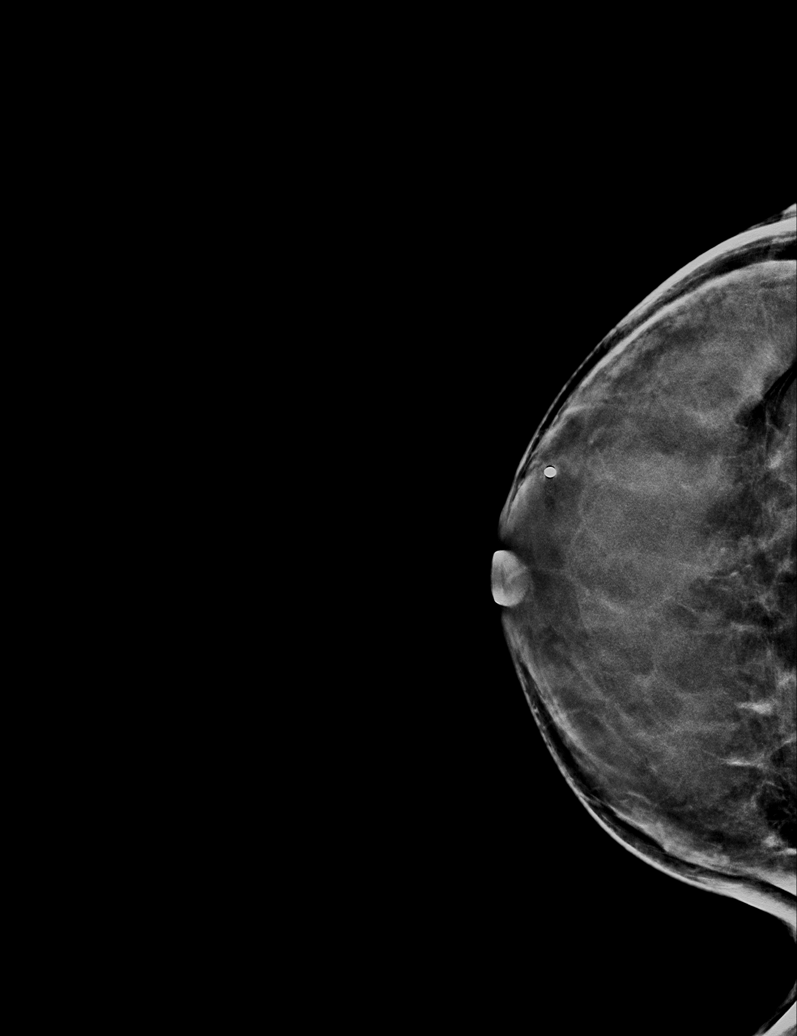

[L MLO synth-2D]
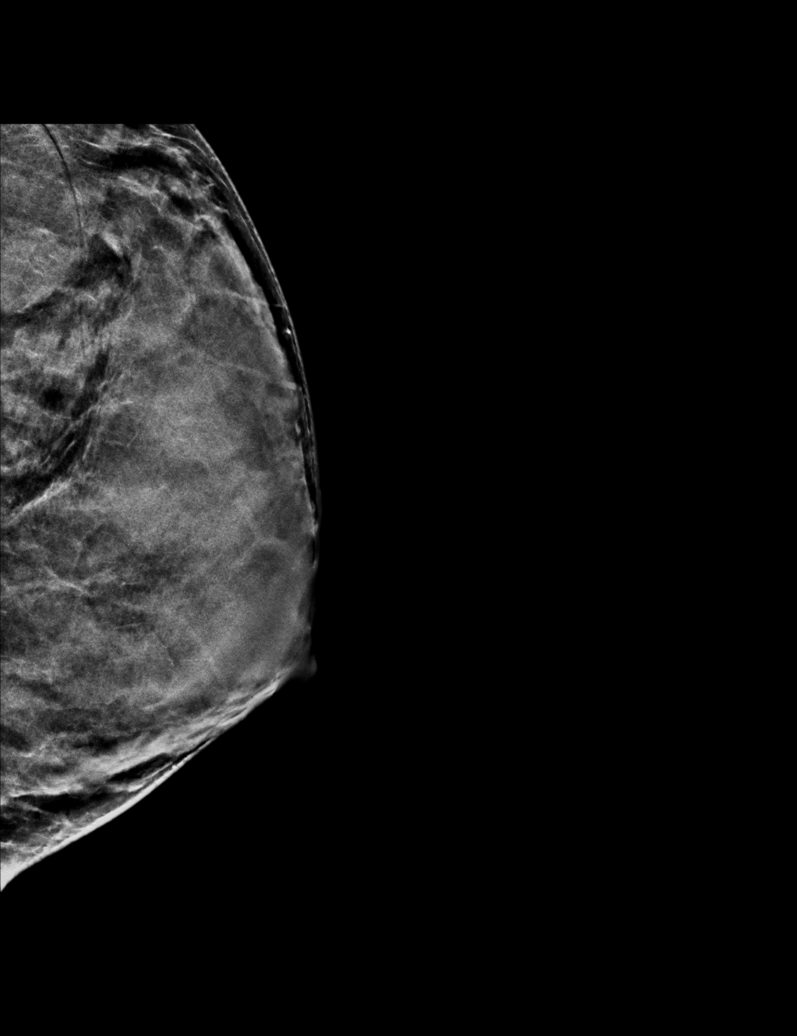

[R MLO synth-2D (2 of 2)]
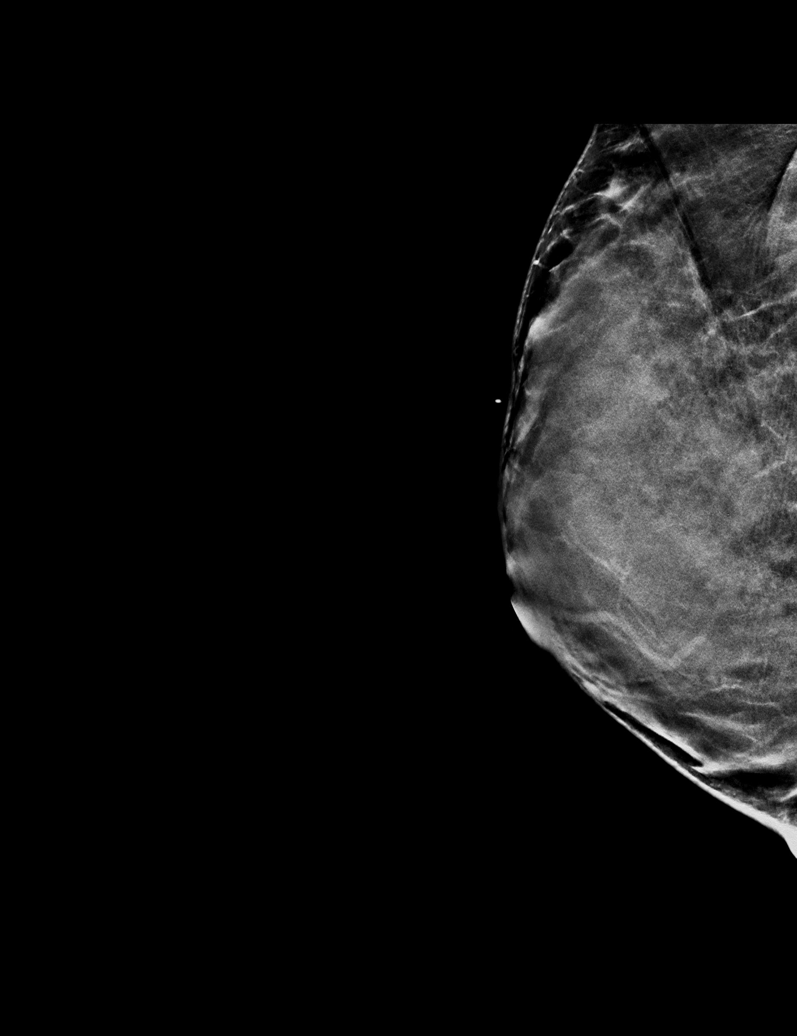

[6 of 36 positions shown; findings below may reference images not displayed]

ACR Breast Density Category d: The breast tissue is extremely dense,
which lowers the sensitivity of mammography.
FINDINGS: No suspicious mass, distortion, or microcalcifications are
identified to suggest presence of malignancy. Spot tangential view
of the area of concern shows extremely dense tissue without visible
mass.

On physical exam, I palpate discrete mobile superficial mass in the
11 o'clock location of the RIGHT breast near the nipple.

Targeted ultrasound is performed, showing a circumscribed oval
hypoechoic mass in the 11 o'clock location RIGHT breast 2
centimeters from nipple measuring 1.0 x 1.1 by 1.4 centimeters. In
5678, mass measured 1.7 x 1.1 x 1.8 centimeters.
IMPRESSION: Stable appearance of solid mass in the 11 o'clock location of the
RIGHT breast. Findings are consistent with benign fibroadenoma given
the long-term stability.

RECOMMENDATION:
Screening mammogram at age 40 unless there are persistent or
intervening clinical concerns. (Code:SB-Z-Y3L)

I have discussed the findings and recommendations with the patient.
If applicable, a reminder letter will be sent to the patient
regarding the next appointment.

BI-RADS CATEGORY  2: Benign.

## 2023-06-04 ENCOUNTER — Other Ambulatory Visit: Payer: Self-pay | Admitting: Nurse Practitioner

## 2023-06-04 DIAGNOSIS — N920 Excessive and frequent menstruation with regular cycle: Secondary | ICD-10-CM

## 2023-06-18 ENCOUNTER — Ambulatory Visit
Admission: RE | Admit: 2023-06-18 | Discharge: 2023-06-18 | Disposition: A | Discharge status: Home / Self Care | Payer: No Typology Code available for payment source | Source: Ambulatory Visit | Attending: Nurse Practitioner | Admitting: Nurse Practitioner

## 2023-06-18 DIAGNOSIS — N920 Excessive and frequent menstruation with regular cycle: Secondary | ICD-10-CM

## 2023-12-28 ENCOUNTER — Emergency Department (HOSPITAL_COMMUNITY)
Admission: EM | Admit: 2023-12-28 | Discharge: 2023-12-28 | Disposition: A | Discharge status: Home / Self Care | Payer: PRIVATE HEALTH INSURANCE | Attending: Emergency Medicine | Admitting: Emergency Medicine

## 2023-12-28 ENCOUNTER — Other Ambulatory Visit: Payer: Self-pay

## 2023-12-28 ENCOUNTER — Encounter (HOSPITAL_COMMUNITY): Payer: Self-pay | Admitting: *Deleted

## 2023-12-28 DIAGNOSIS — R519 Headache, unspecified: Secondary | ICD-10-CM | POA: Insufficient documentation

## 2023-12-28 DIAGNOSIS — R0981 Nasal congestion: Secondary | ICD-10-CM | POA: Diagnosis not present

## 2023-12-28 DIAGNOSIS — J111 Influenza due to unidentified influenza virus with other respiratory manifestations: Secondary | ICD-10-CM

## 2023-12-28 DIAGNOSIS — R059 Cough, unspecified: Secondary | ICD-10-CM | POA: Insufficient documentation

## 2023-12-28 LAB — RESP PANEL BY RT-PCR (RSV, FLU A&B, COVID)  RVPGX2
Influenza A by PCR: NEGATIVE
Influenza B by PCR: NEGATIVE
Resp Syncytial Virus by PCR: NEGATIVE
SARS Coronavirus 2 by RT PCR: NEGATIVE

## 2023-12-28 MED ORDER — IBUPROFEN 400 MG PO TABS
400.0000 mg | ORAL_TABLET | Freq: Once | ORAL | Status: AC
  Administered 2023-12-28: 400 mg via ORAL
  Filled 2023-12-28: qty 1

## 2023-12-28 NOTE — ED Triage Notes (Signed)
 Pt c/o generalized body aches with headache since Saturday

## 2023-12-28 NOTE — ED Provider Notes (Signed)
 [place] EMERGENCY DEPARTMENT AT [facility] Provider Note   CSN: [id number] Arrival date & time: 12/28/23  0439     History  Chief Complaint  Patient presents with   Generalized Body Aches    Annette Armstrong is a 38 y.o. female.  Here with a couople days of body aches, congestion, headache. Similar to when she had covid in the past. Decreased intake. No known sick contacts aside from intermittently people at work. No neck pain. No emesis. No neurologic changes.         Home Medications Prior to Admission medications   Medication Sig Start Date End Date Taking? Authorizing Provider  benzonatate (TESSALON) 100 MG capsule Take 1 capsule (100 mg total) by mouth 3 (three) times daily as needed for cough. 05/03/22   Pricilla Loveless, MD  ibuprofen (ADVIL) 200 MG tablet Take 600 mg by mouth every 8 (eight) hours as needed for fever, headache or moderate pain.    [provider]  Multiple Vitamin (MULTIVITAMIN) capsule Take 1 capsule by mouth daily.    [provider]  oseltamivir (TAMIFLU) 75 MG capsule Take 1 capsule (75 mg total) by mouth every 12 (twelve) hours. 10/01/22   Geoffery Lyons, MD      Allergies    Coconut flavoring agent (non-screening)    Review of Systems   Review of Systems  Physical Exam Updated Vital Signs BP 118/75   Pulse (!) 102   Temp (!) 103.1 F (39.5 C) (Oral)   Resp 20   Ht 5\' 1"  (1.549 m)   Wt 59 kg   LMP 12/15/2023   SpO2 98%   BMI 24.56 kg/m  Physical Exam Vitals and nursing note reviewed.  Constitutional:      Appearance: She is well-developed.  HENT:     Head: Normocephalic and atraumatic.  Cardiovascular:     Rate and Rhythm: Normal rate and regular rhythm.  Pulmonary:     Effort: No respiratory distress.     Breath sounds: No stridor.  Abdominal:     General: There is no distension.  Musculoskeletal:        General: No swelling, tenderness or deformity. Normal range of motion.     Cervical back:  Normal range of motion. No tenderness.  Neurological:     General: No focal deficit present.     Mental Status: She is alert.     Comments: No altered mental status, able to give full seemingly accurate history.  Face is symmetric, EOM's intact, pupils equal and reactive, vision intact, tongue and uvula midline without deviation. Upper and Lower extremity motor 5/5, intact pain perception in distal extremities, 2+ reflexes in biceps, patella and achilles tendons. Able to perform finger to nose normal with both hands. Walks without assistance or evident ataxia.       ED Results / Procedures / Treatments   Labs (all labs ordered are listed, but only abnormal results are displayed) Labs Reviewed  RESP PANEL BY RT-PCR (RSV, FLU A&B, COVID)  RVPGX2    EKG None  Radiology No results found.  Procedures Procedures    Medications Ordered in ED Medications  ibuprofen (ADVIL) tablet 400 mg (400 mg Oral Given 12/28/23 0532)    ED Course/ Medical Decision Making/ A&P                                 Medical Decision Making Risk Prescription drug  management.   Doubt meningitis without any other e/o meningismus. Likely viral, covid/flu/rsv ordered, d/c with suggestion for OTC meds, rest, hydration and pcp follow up if not improving. Not a candidate for tamiflu.   Final Clinical Impression(s) / ED Diagnoses Final diagnoses:  Influenza-like illness    Rx / DC Orders ED Discharge Orders     None         Sanaz Scarlett, Barbara Cower, MD 12/28/23 [phone]

## 2024-07-14 ENCOUNTER — Emergency Department (HOSPITAL_COMMUNITY): Payer: PRIVATE HEALTH INSURANCE

## 2024-07-14 ENCOUNTER — Other Ambulatory Visit: Payer: Self-pay

## 2024-07-14 ENCOUNTER — Emergency Department (HOSPITAL_COMMUNITY)
Admission: EM | Admit: 2024-07-14 | Discharge: 2024-07-15 | Disposition: A | Discharge status: Home / Self Care | Payer: PRIVATE HEALTH INSURANCE | Attending: Emergency Medicine | Admitting: Emergency Medicine

## 2024-07-14 DIAGNOSIS — Z711 Person with feared health complaint in whom no diagnosis is made: Secondary | ICD-10-CM

## 2024-07-14 DIAGNOSIS — N760 Acute vaginitis: Secondary | ICD-10-CM | POA: Insufficient documentation

## 2024-07-14 DIAGNOSIS — B9689 Other specified bacterial agents as the cause of diseases classified elsewhere: Secondary | ICD-10-CM | POA: Diagnosis not present

## 2024-07-14 DIAGNOSIS — N898 Other specified noninflammatory disorders of vagina: Secondary | ICD-10-CM | POA: Diagnosis present

## 2024-07-14 LAB — COMPREHENSIVE METABOLIC PANEL WITH GFR
ALT: 15 U/L (ref 0–44)
AST: 18 U/L (ref 15–41)
Albumin: 3.9 g/dL (ref 3.5–5.0)
Alkaline Phosphatase: 56 U/L (ref 38–126)
Anion gap: 9 (ref 5–15)
BUN: 10 mg/dL (ref 6–20)
CO2: 24 mmol/L (ref 22–32)
Calcium: 9 mg/dL (ref 8.9–10.3)
Chloride: 101 mmol/L (ref 98–111)
Creatinine, Ser: 0.68 mg/dL (ref 0.44–1.00)
GFR, Estimated: >60 mL/min (ref >60)
Glucose, Bld: 97 mg/dL (ref 70–99)
Potassium: 3.9 mmol/L (ref 3.5–5.1)
Sodium: 134 mmol/L — ABNORMAL LOW (ref 135–145)
Total Bilirubin: 0.8 mg/dL (ref 0.0–1.2)
Total Protein: 7.5 g/dL (ref 6.5–8.1)

## 2024-07-14 LAB — CBC
HCT: 39 % (ref 36.0–46.0)
Hemoglobin: 13 g/dL (ref 12.0–15.0)
MCH: 28.1 pg (ref 26.0–34.0)
MCHC: 33.3 g/dL (ref 30.0–36.0)
MCV: 84.2 fL (ref 80.0–100.0)
Platelets: 234 K/uL (ref 150–400)
RBC: 4.63 MIL/uL (ref 3.87–5.11)
RDW: 14.8 % (ref 11.5–15.5)
WBC: 9.2 K/uL (ref 4.0–10.5)
nRBC: 0 % (ref 0.0–0.2)

## 2024-07-14 LAB — URINALYSIS, ROUTINE W REFLEX MICROSCOPIC
Bilirubin Urine: NEGATIVE
Glucose, UA: NEGATIVE mg/dL
Hgb urine dipstick: NEGATIVE
Ketones, ur: NEGATIVE mg/dL
Nitrite: NEGATIVE
Protein, ur: NEGATIVE mg/dL
Specific Gravity, Urine: 1.017 (ref 1.005–1.030)
pH: 5 (ref 5.0–8.0)

## 2024-07-14 LAB — WET PREP, GENITAL
Sperm: NONE SEEN
Trich, Wet Prep: NONE SEEN
WBC, Wet Prep HPF POC: >=10 — AB (ref <10)
Yeast Wet Prep HPF POC: NONE SEEN

## 2024-07-14 LAB — LIPASE, BLOOD: Lipase: 17 U/L (ref 11–51)

## 2024-07-14 LAB — HCG, SERUM, QUALITATIVE: Preg, Serum: NEGATIVE

## 2024-07-14 MED ORDER — CEFTRIAXONE SODIUM 500 MG IJ SOLR
500.0000 mg | Freq: Once | INTRAMUSCULAR | Status: AC
  Administered 2024-07-14: 500 mg via INTRAMUSCULAR
  Filled 2024-07-14: qty 500

## 2024-07-14 MED ORDER — LIDOCAINE HCL (PF) 1 % IJ SOLN
INTRAMUSCULAR | Status: AC
  Administered 2024-07-14: 1 mL
  Filled 2024-07-14: qty 5

## 2024-07-14 MED ORDER — KETOROLAC TROMETHAMINE 30 MG/ML IJ SOLN
30.0000 mg | Freq: Once | INTRAMUSCULAR | Status: AC
  Administered 2024-07-14: 30 mg via INTRAVENOUS
  Filled 2024-07-14: qty 1

## 2024-07-14 MED ORDER — DOXYCYCLINE HYCLATE 100 MG PO TABS
100.0000 mg | ORAL_TABLET | Freq: Once | ORAL | Status: AC
  Administered 2024-07-14: 100 mg via ORAL
  Filled 2024-07-14: qty 1

## 2024-07-14 MED ORDER — METRONIDAZOLE 500 MG PO TABS
500.0000 mg | ORAL_TABLET | Freq: Once | ORAL | Status: AC
  Administered 2024-07-14: 500 mg via ORAL
  Filled 2024-07-14: qty 1

## 2024-07-14 MED ORDER — SODIUM CHLORIDE 0.9 % IV BOLUS
1000.0000 mL | Freq: Once | INTRAVENOUS | Status: AC
  Administered 2024-07-14: 1000 mL via INTRAVENOUS

## 2024-07-14 MED ORDER — LIDOCAINE HCL (PF) 1 % IJ SOLN: 1.0000 mL | Freq: Once | INTRAMUSCULAR | Status: AC

## 2024-07-14 NOTE — ED Notes (Signed)
Pelvic cart set up at bedside  

## 2024-07-14 NOTE — ED Provider Notes (Signed)
 [place] EMERGENCY DEPARTMENT AT [facility] Provider Note   CSN: [id number] Arrival date & time: 07/14/24  1935     Patient presents with: Abdominal Pain   Annette Armstrong is a 38 y.o. female history of fibroids here presenting with pelvic pain and vaginal discharge.  Patient states that she is sexually active and had sex 2 days ago.  Patient states that she has some vaginal discharge for several days that is yellowish in nature.  Patient also has some pelvic pain as well.   The history is provided by the patient.       Prior to Admission medications   Medication Sig Start Date End Date Taking? Authorizing Provider  benzonatate  (TESSALON ) 100 MG capsule Take 1 capsule (100 mg total) by mouth 3 (three) times daily as needed for cough. 05/03/22   Freddi Hamilton, MD  ibuprofen  (ADVIL ) 200 MG tablet Take 600 mg by mouth every 8 (eight) hours as needed for fever, headache or moderate pain.    [provider]  Multiple Vitamin (MULTIVITAMIN) capsule Take 1 capsule by mouth daily.    [provider]  oseltamivir  (TAMIFLU ) 75 MG capsule Take 1 capsule (75 mg total) by mouth every 12 (twelve) hours. 10/01/22   Geroldine Berg, MD    Allergies: Coconut flavoring agent (non-screening)    Review of Systems  Gastrointestinal:  Positive for abdominal pain.  All other systems reviewed and are negative.   Updated Vital Signs BP 123/62 (BP Location: Right Arm)   Pulse 89   Temp 99.1 F (37.3 C)   Resp 16   Ht 5' 2 (1.575 m)   Wt 56.7 kg   SpO2 100%   BMI 22.86 kg/m   Physical Exam Vitals and nursing note reviewed.  Constitutional:      Appearance: She is well-developed.  HENT:     Head: Normocephalic.     Mouth/Throat:     Mouth: Mucous membranes are moist.  Eyes:     Extraocular Movements: Extraocular movements intact.     Pupils: Pupils are equal, round, and reactive to light.  Cardiovascular:     Rate and Rhythm: Normal rate and regular  rhythm.     Heart sounds: Normal heart sounds.  Pulmonary:     Effort: Pulmonary effort is normal.     Breath sounds: Normal breath sounds.  Abdominal:     General: Abdomen is flat.     Comments: Mild pelvic tenderness  Genitourinary:    Comments: Yellowish discharge.  No obvious CMT.  Patient does have mild uterine tenderness.  No adnexal tenderness Skin:    General: Skin is warm.  Neurological:     Mental Status: She is alert.     (all labs ordered are listed, but only abnormal results are displayed) Labs Reviewed  COMPREHENSIVE METABOLIC PANEL WITH GFR - Abnormal; Notable for the following components:      Result Value   Sodium 134 (*)    All other components within normal limits  URINALYSIS, ROUTINE W REFLEX MICROSCOPIC - Abnormal; Notable for the following components:   APPearance HAZY (*)    Leukocytes,Ua LARGE (*)    Bacteria, UA RARE (*)    All other components within normal limits  WET PREP, GENITAL  CBC  HCG, SERUM, QUALITATIVE  LIPASE, BLOOD  GC/CHLAMYDIA PROBE AMP () NOT AT [facility]    EKG: None  Radiology: No results found.   Procedures   Medications Ordered in the ED  sodium chloride  0.9 % bolus 1,000 mL (has no administration in time range)  ketorolac (TORADOL) 30 MG/ML injection 30 mg (has no administration in time range)  cefTRIAXone  (ROCEPHIN ) injection 500 mg (500 mg Intramuscular Given 07/14/24 2159)  doxycycline (VIBRA-TABS) tablet 100 mg (100 mg Oral Given 07/14/24 2156)  lidocaine  (PF) (XYLOCAINE ) 1 % injection 1 mL (1 mL Other Given 07/14/24 2159)                                    Medical Decision Making Annette Armstrong is a 38 y.o. female here presenting with pelvic pain and discharge.  Concern for possible STD versus PID versus pain from fibroids.   11:02 PM Reviewed patient's labs and patient has clue cells.  Pelvic ultrasound is pending.  Anticipate patient will need to be discharged with doxycycline and Flagyl .  Signed out to  Dr. Theadore in the ED.   Problems Addressed: BV (bacterial vaginosis): acute illness or injury Concern about STD in female without diagnosis: acute illness or injury  Amount and/or Complexity of Data Reviewed Labs: ordered. Radiology: ordered.  Risk Prescription drug management.     Final diagnoses:  None    ED Discharge Orders     None          Patt Alm Macho, MD 07/14/24 2304

## 2024-07-14 NOTE — ED Provider Notes (Signed)
  Provider Note MRN:  [id number]  Arrival date & time: 07/15/24    ED Course and Medical Decision Making  Assumed care of patient at sign-out or upon transfer.  Abdominal pain, concern for STD/PID.  Ultrasound unremarkable.  Patient well-appearing on my assessment, appropriate for discharge.  Procedures  Final Clinical Impressions(s) / ED Diagnoses     ICD-10-CM   1. BV (bacterial vaginosis)  N76.0    B96.89     2. Concern about STD in female without diagnosis  Z71.1       ED Discharge Orders          Ordered    metroNIDAZOLE  (FLAGYL ) 500 MG tablet  2 times daily        07/15/24 0100    doxycycline (VIBRAMYCIN) 100 MG capsule  2 times daily        07/15/24 0100              Discharge Instructions      You were evaluated in the Emergency Department and after careful evaluation, we did not find any emergent condition requiring admission or further testing in the hospital.  Your exam/testing today is overall reassuring.  Symptoms may be related to an infection.  Important that you take the doxycycline and metronidazole  medications as prescribed.  Can use Tylenol  and Motrin  for discomfort.  Please return to the Emergency Department if you experience any worsening of your condition.   Thank you for allowing us  to be a part of your care.      Ozell HERO. Theadore, MD [facility] Emergency Medicine [facility] [email or web address]    Theadore Ozell HERO, MD 07/15/24 [phone]

## 2024-07-14 NOTE — ED Triage Notes (Signed)
 Pt c/o lower abd pain for three days with yellow vaginal discharge. LMP 9/14. Last BM two days ago. Denies N/V.

## 2024-07-14 NOTE — ED Triage Notes (Signed)
 Patient states she has lower abdominal pain for the past 3 days. Denies nausea vomiting and diarrhea. Reports irregular bowel movements.

## 2024-07-15 LAB — GC/CHLAMYDIA PROBE AMP (~~LOC~~) NOT AT ARMC
Chlamydia: NEGATIVE
Comment: NEGATIVE
Comment: NORMAL
Neisseria Gonorrhea: NEGATIVE

## 2024-07-15 MED ORDER — METRONIDAZOLE 500 MG PO TABS
500.0000 mg | ORAL_TABLET | Freq: Two times a day (BID) | ORAL | 0 refills | Status: AC
Start: 2024-07-15 — End: 2024-07-22

## 2024-07-15 MED ORDER — DOXYCYCLINE HYCLATE 100 MG PO CAPS: 100.0000 mg | ORAL_CAPSULE | Freq: Two times a day (BID) | ORAL | 0 refills | Status: AC

## 2024-07-15 NOTE — Discharge Instructions (Signed)
 You were evaluated in the Emergency Department and after careful evaluation, we did not find any emergent condition requiring admission or further testing in the hospital.  Your exam/testing today is overall reassuring.  Symptoms may be related to an infection.  Important that you take the doxycycline and metronidazole  medications as prescribed.  Can use Tylenol  and Motrin  for discomfort.  Please return to the Emergency Department if you experience any worsening of your condition.   Thank you for allowing us  to be a part of your care.

## 2024-10-28 ENCOUNTER — Encounter: Payer: Self-pay | Admitting: Nurse Practitioner

## 2024-10-28 ENCOUNTER — Other Ambulatory Visit: Payer: Self-pay | Admitting: Nurse Practitioner

## 2024-10-28 DIAGNOSIS — D219 Benign neoplasm of connective and other soft tissue, unspecified: Secondary | ICD-10-CM

## 2024-11-07 ENCOUNTER — Other Ambulatory Visit: Payer: PRIVATE HEALTH INSURANCE

## 2024-11-08 ENCOUNTER — Other Ambulatory Visit: Payer: Self-pay | Admitting: Nurse Practitioner

## 2024-11-08 DIAGNOSIS — D219 Benign neoplasm of connective and other soft tissue, unspecified: Secondary | ICD-10-CM

## 2024-11-11 ENCOUNTER — Ambulatory Visit
Admission: RE | Admit: 2024-11-11 | Discharge: 2024-11-11 | Disposition: A | Payer: PRIVATE HEALTH INSURANCE | Source: Ambulatory Visit | Attending: Nurse Practitioner

## 2024-11-11 DIAGNOSIS — D219 Benign neoplasm of connective and other soft tissue, unspecified: Secondary | ICD-10-CM
